# Patient Record
Sex: Female | Born: 1985 | Race: White | Hispanic: No | Marital: Single
Health system: Midwestern US, Community
[De-identification: ages and names within clinical notes are randomized; demographics above are authoritative.]

---

## 2016-06-25 ENCOUNTER — Inpatient Hospital Stay: Admit: 2016-06-25 | Discharge: 2016-06-25 | Attending: Emergency Medicine

## 2016-06-25 DIAGNOSIS — H9201 Otalgia, right ear: Secondary | ICD-10-CM

## 2016-06-25 MED ORDER — NAPROXEN 500 MG PO TABS
500 MG | Freq: Once | ORAL | Status: AC
Start: 2016-06-25 — End: 2016-06-25
  Administered 2016-06-25: 06:00:00 500 mg via ORAL

## 2016-06-25 MED ORDER — NAPROXEN 500 MG PO TABS
500 MG | ORAL_TABLET | Freq: Two times a day (BID) | ORAL | 0 refills | Status: AC
Start: 2016-06-25 — End: ?

## 2016-06-25 MED ORDER — CETIRIZINE-PSEUDOEPHEDRINE ER 5-120 MG PO TB12
5-120 MG | ORAL_TABLET | Freq: Two times a day (BID) | ORAL | 0 refills | Status: AC
Start: 2016-06-25 — End: 2016-07-05

## 2016-06-25 MED ORDER — DIPHENHYDRAMINE HCL 25 MG PO TABS
25 MG | Freq: Once | ORAL | Status: AC
Start: 2016-06-25 — End: 2016-06-25
  Administered 2016-06-25: 06:00:00 50 mg via ORAL

## 2016-06-25 MED FILL — DIPHENHYDRAMINE HCL 25 MG PO TABS: 25 MG | ORAL | Qty: 2

## 2016-06-25 MED FILL — NAPROXEN 500 MG PO TABS: 500 MG | ORAL | Qty: 1

## 2016-06-25 NOTE — ED Notes (Signed)
Discharge instructions reviewed and pt acknowledges understanding. Ambulatory at discharge.     Johnnette Barrios, RN  06/25/16 [phone]

## 2016-06-25 NOTE — ED Provider Notes (Signed)
The history is provided by the patient.   Ear Problem   Location:  Right  Behind ear:  No abnormality  Quality:  Aching and dull  Severity:  Moderate  Onset quality:  Gradual  Duration:  2 days  Timing:  Constant  Progression:  Worsening  Chronicity:  New  Context comment:  Started on antibiotics for dental caries and pain  Relieved by:  Nothing  Worsened by:  Nothing  Ineffective treatments:  None tried  Associated symptoms: congestion, cough and rhinorrhea    Associated symptoms: no ear discharge, no fever, no hearing loss, no neck pain, no sore throat and no tinnitus        Review of Systems   Constitutional: Negative.  Negative for fever.   HENT: Positive for congestion and rhinorrhea. Negative for ear discharge, hearing loss, sore throat and tinnitus.    Eyes: Negative.    Respiratory: Positive for cough.    Cardiovascular: Negative.    Gastrointestinal: Negative.    Genitourinary: Negative.    Musculoskeletal: Negative.  Negative for neck pain.   Skin: Negative.    Neurological: Negative.    Endo/Heme/Allergies: Negative.    All other systems reviewed and are negative.      History reviewed. No pertinent family history.  Social History     Social History   . Marital status: Single     Spouse name: N/A   . Number of children: N/A   . Years of education: N/A     Occupational History   . Not on file.     Social History Main Topics   . Smoking status: Current Every Day Smoker     Packs/day: 1.00     Types: Cigarettes   . Smokeless tobacco: Never Used   . Alcohol use Not on file   . Drug use: No   . Sexual activity: Yes     Partners: Male     Other Topics Concern   . Not on file     Social History Narrative   . No narrative on file     Past Surgical History:   Procedure Laterality Date   . APPENDECTOMY     . CHOLECYSTECTOMY       Past Medical History:   Diagnosis Date   . Cancer (HCC)     Cervical   . GERD (gastroesophageal reflux disease)      Allergies   Allergen Reactions   . Grapeseed Extract [Nutritional  Supplements]      Prior to Admission medications    Medication Sig Start Date End Date Taking? Authorizing Provider   cetirizine-psuedoephedrine (ZYRTEC-D ALLERGY & CONGESTION) 5-120 MG per extended release tablet Take 1 tablet by mouth 2 times daily for 10 days 06/25/16 07/05/16 Yes Caitlyn Roach Khori Rosevear, DO   naproxen (NAPROSYN) 500 MG tablet Take 1 tablet by mouth 2 times daily 06/25/16  Yes Caitlyn Roach Bennett Vanscyoc, DO   ranitidine (ZANTAC) 150 MG tablet  06/06/16   Historical Provider, MD   progesterone (PROMETRIUM) 200 MG capsule  06/23/16   Historical Provider, MD   penicillin v potassium (VEETID) 500 MG tablet  06/15/16   Historical Provider, MD   omeprazole (PRILOSEC) 20 MG delayed release capsule  06/22/16   Historical Provider, MD   ibuprofen (ADVIL;MOTRIN) 800 MG tablet  06/16/16   Historical Provider, MD   fluconazole (DIFLUCAN) 150 MG tablet  06/23/16   Historical Provider, MD       BP (!) 94/52   Pulse  68   Temp 97.6 F (36.4 C)   Resp 18   Ht  (1.651 m)   Wt 135 lb (61.2 kg)   SpO2 97%   BMI 22.47 kg/m     Physical Exam   Constitutional: She is oriented to person, place, and time. She appears well-developed and well-nourished.   HENT:   Head: Normocephalic and atraumatic.   Right Ear: External ear normal. Tympanic membrane is bulging. Tympanic membrane mobility is abnormal.   Left Ear: External ear normal.   Nose: Nose normal.   Mouth/Throat: Oropharynx is clear and moist.   Eyes: Conjunctivae and EOM are normal. Pupils are equal, round, and reactive to light.   Neck: Normal range of motion. Neck supple.   Cardiovascular: Normal rate, regular rhythm, normal heart sounds and intact distal pulses.    Pulmonary/Chest: Effort normal and breath sounds normal.   Abdominal: Soft. Bowel sounds are normal.   Musculoskeletal: Normal range of motion.   Neurological: She is alert and oriented to person, place, and time. GCS eye subscore is 4. GCS verbal subscore is 5. GCS motor subscore is 6.   Skin: Skin is warm and  dry.   Psychiatric: She has a normal mood and affect. Her behavior is normal. Judgment and thought content normal.       MDM:    No results found for this visit on 06/25/16.      Patient drove over one hour to our ER for her ear pain. She was surprised we could pull her records and see she was on antibiotics for dental pain. The patient ear pain is most likely from eustacian tube dysfunction so I am giving her zyrtec D and naprosyn. She needs to finish her antibiotic and call her dentist   My typical dicussion, presentation, and considerations for this patients' chief complaint, diagnosis, differential diagnosis, medications, medication use,  medication safety and medication interactions have been explained and outlined to this patient for this patient encounter. I have stressed need for follow up and reexamination for this encounter and or return to the emergency department if any changes or any concern.      Final Impression    1. Right ear pain              Caitlyn Roach Isaul Landi, DO  06/25/16 [phone]

## 2018-10-22 IMAGING — MR MRI BRAIN W/WO CONTRAST
10 series · 48 of 48 positions shown · IV contrast (prohance)
Comparison: None

HISTORY: Trigeminal neuralgia
TECHNIQUE: Multiplanar, multisequential MRI images of the brain are obtained prior to and following 15 mL ProHance intravenous contrast using a cranial nerve study protocol.

[Series 5: t1_sag · sagittal · B · 5.0mm · 0.72mm/px · 2 of 25 slices shown]
[im 1/25]
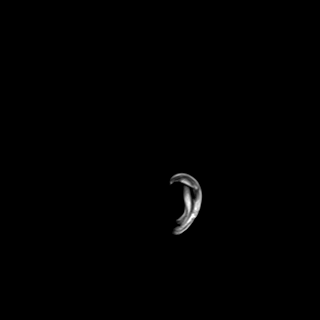
[im 25/25]
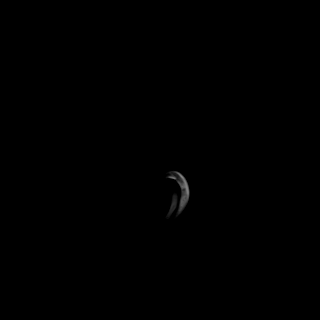

[Series 6: flair_axial_fs · axial · B · 4.0mm · 0.80mm/px · z∈[-64,+82]mm · 4 of 30 slices shown]
[im 1/30]
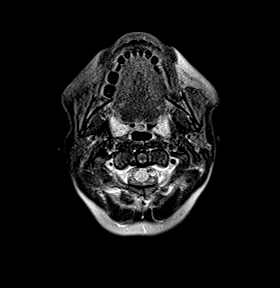
[im 10/30]
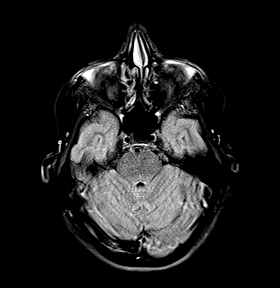
[im 20/30]
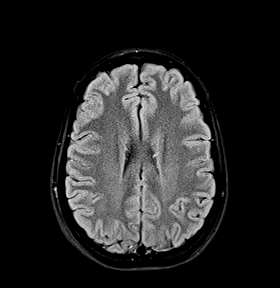
[im 30/30]
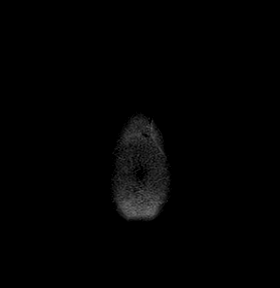

[Series 7: t2_ci3d_axial · axial · B · 1.0mm · 0.74mm/px · z∈[-58,+2]mm · 8 of 64 slices shown]
[im 1/64]
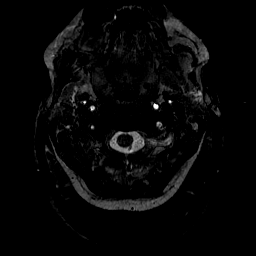
[im 10/64]
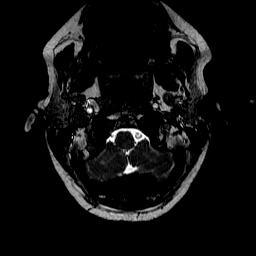
[im 19/64]
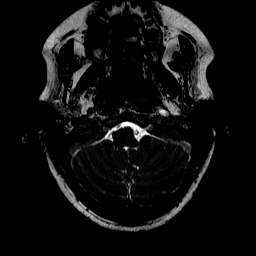
[im 28/64]
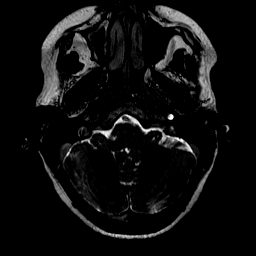
[im 37/64]
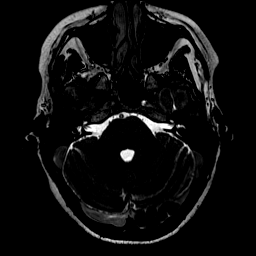
[im 46/64]
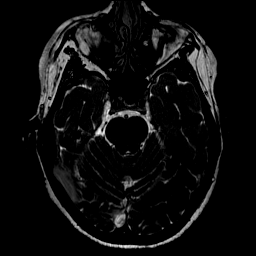
[im 55/64]
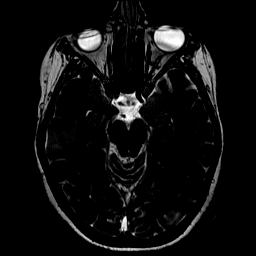
[im 64/64]
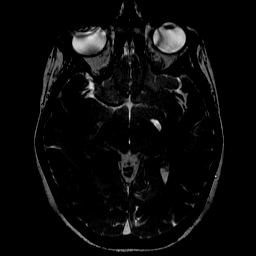

[Series 8: DWI · axial · B · 4.0mm · 1.31mm/px · z∈[-64,+82]mm · 4 of 30 slices shown (1 of 2)]
[im 1/30]
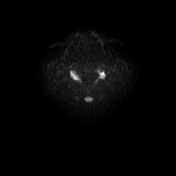
[im 10/30]
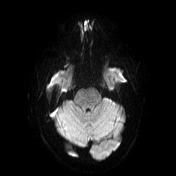
[im 20/30]
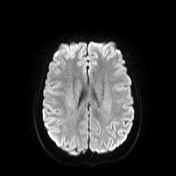
[im 30/30]
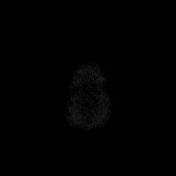

[Series 9: DWI · axial · B · 4.0mm · 1.31mm/px · z∈[-64,+82]mm · 4 of 30 slices shown (2 of 2)]
[im 1/30]
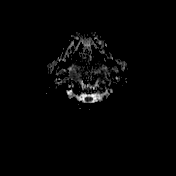
[im 10/30]
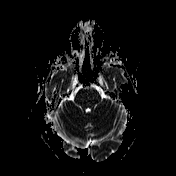
[im 20/30]
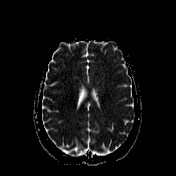
[im 30/30]
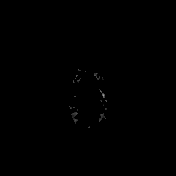

[Series 10: t1_axial_fs · axial · B · 1.0mm · 0.59mm/px · z∈[-56,+0]mm · 7 of 60 slices shown]
[im 1/60]
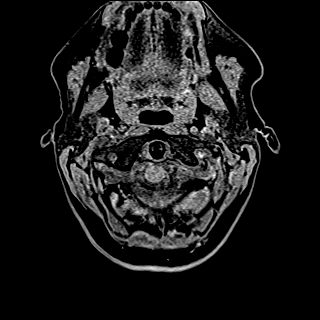
[im 10/60]
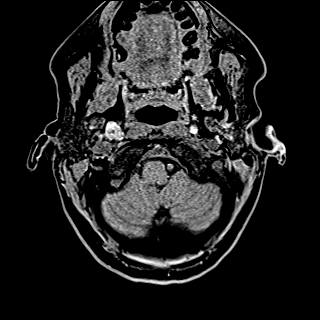
[im 20/60]
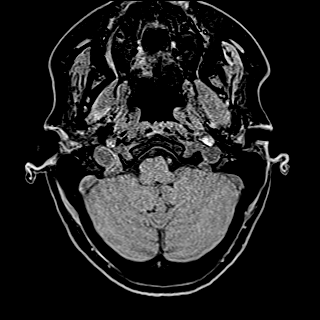
[im 30/60]
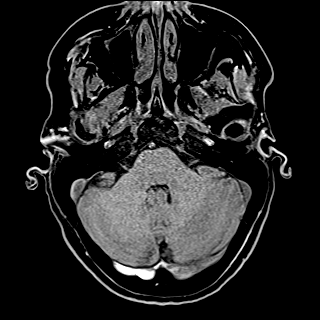
[im 40/60]
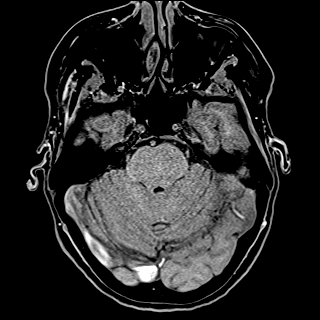
[im 50/60]
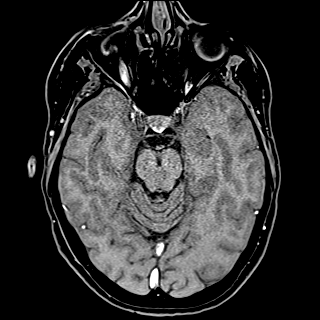
[im 60/60]
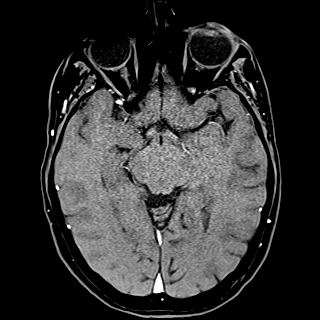

[Series 11: flash_axial · axial · B · 4.0mm · 0.90mm/px · z∈[-64,+82]mm · 4 of 30 slices shown]
[im 1/30]
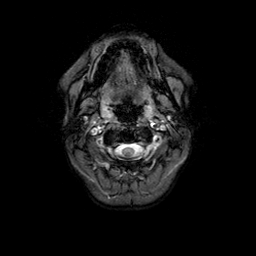
[im 10/30]
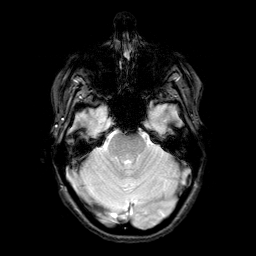
[im 20/30]
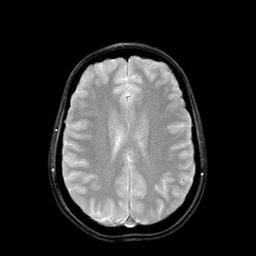
[im 30/30]
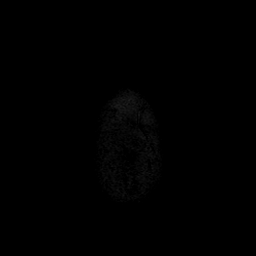

[Series 12: t1_axial_+c_fs · axial · B · 1.0mm · 0.59mm/px · z∈[-56,+0]mm · 7 of 60 slices shown]
[im 1/60]
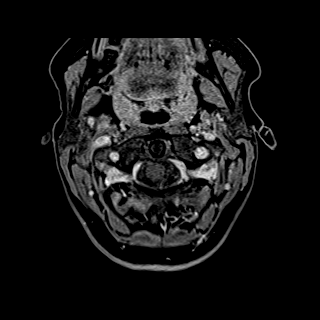
[im 10/60]
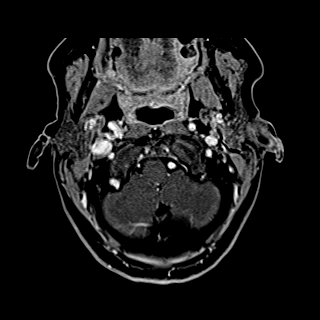
[im 20/60]
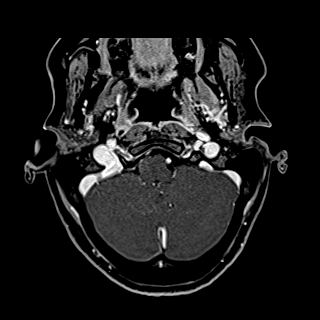
[im 30/60]
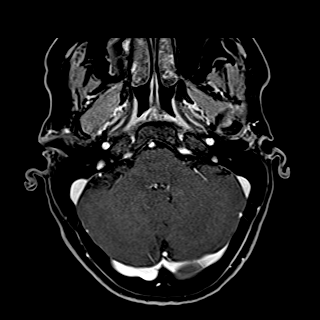
[im 40/60]
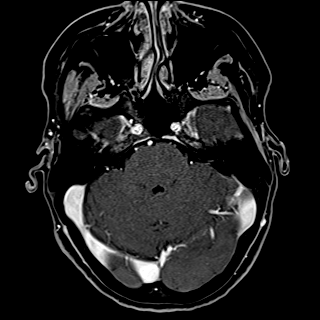
[im 50/60]
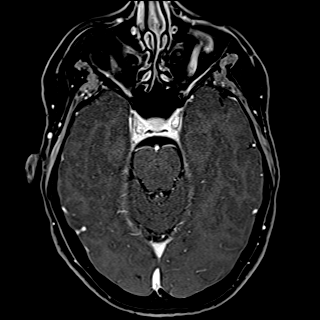
[im 60/60]
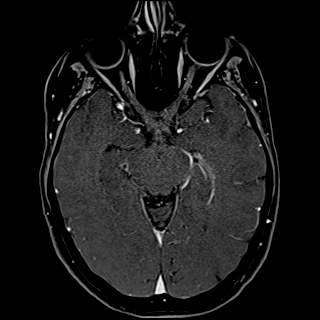

[Series 13: t1_cor_fs_+c · coronal · B · 3.0mm · 0.90mm/px · 4 of 32 slices shown]
[im 1/32]
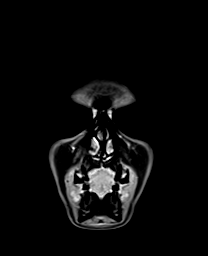
[im 11/32]
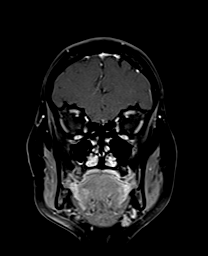
[im 21/32]
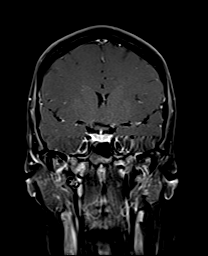
[im 32/32]
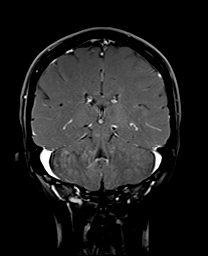

[Series 14: t1_axial+c · axial · B · 4.0mm · 0.72mm/px · z∈[-64,+82]mm · 4 of 30 slices shown]
[im 1/30]
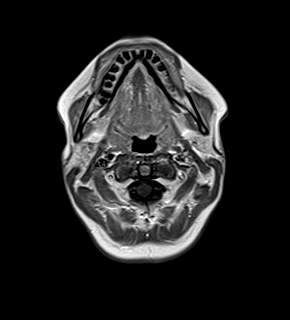
[im 10/30]
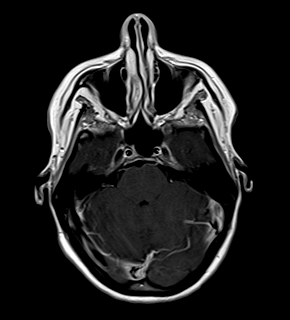
[im 20/30]
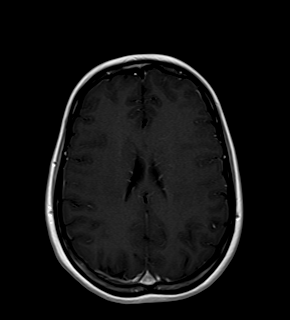
[im 30/30]
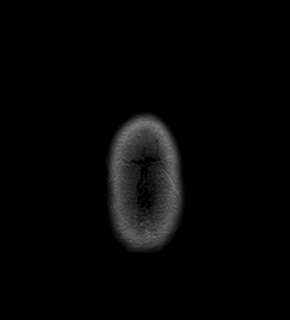

[48 of 48 positions shown; findings below may reference images not displayed]

FINDINGS: The seventh, eighth and fifth cranial nerves appear unremarkable. Meckel's cave region demonstrates nothing unusual bilaterally. No abnormal enhancement.

Internal auditory canals are normal in size and symmetric in appearance. No abnormal signal intensity or contrast enhancement.

Temporal bones demonstrate no destructive lesion. No fluid within the mastoid air cells.

The ventricles and sulci are normal in size and symmetric.

The basal cisterns are patent.

There is no mass effect or midline shift.

There is no restricted diffusion. 

The gray/white matter differentiation is preserved.

There are no intra- or extra-axial fluid collections identified.

Paranasal sinuses are clear

The internal auditory canals and orbits are unremarkable.

There is no abnormal enhancement.
IMPRESSION: Normal MRI of the brain without and with contrast.

## 2019-04-11 IMAGING — US US NON OB TRANSVAGINAL W LTD TA
1 series · 14 of 28 positions shown · non-contrast
Comparison: None

HISTORY: 33 year old female with irregular menstruation. LMP: 03/22/2019 (Day 21).
TECHNIQUE: Gray-scale and color Doppler ultrasound of the pelvis was performed.

[Series 1: us non ob transvaginal w ltd ta · 14 of 40 slices shown]
[im 2/40]
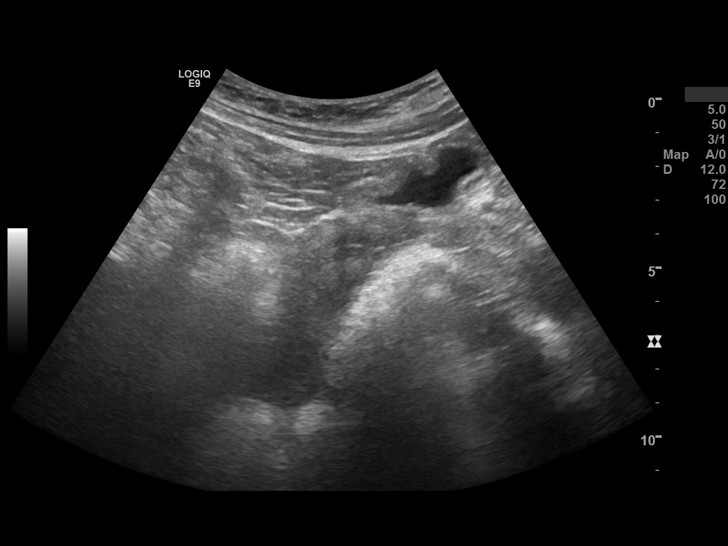
[im 5/40]
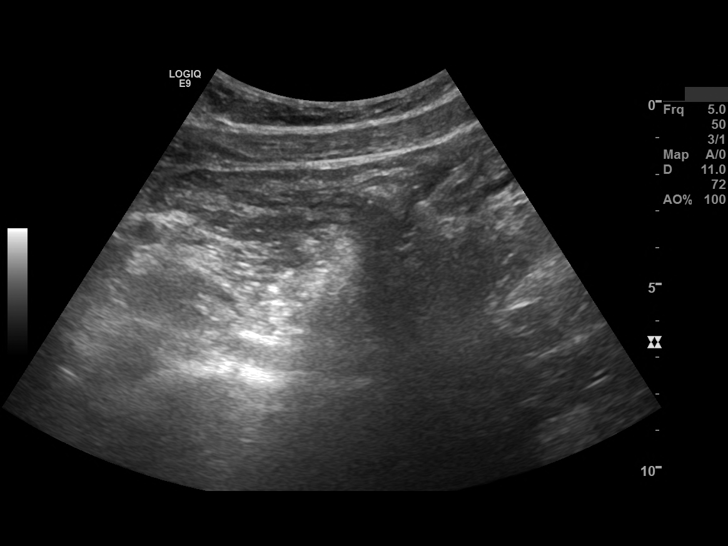
[im 8/40]
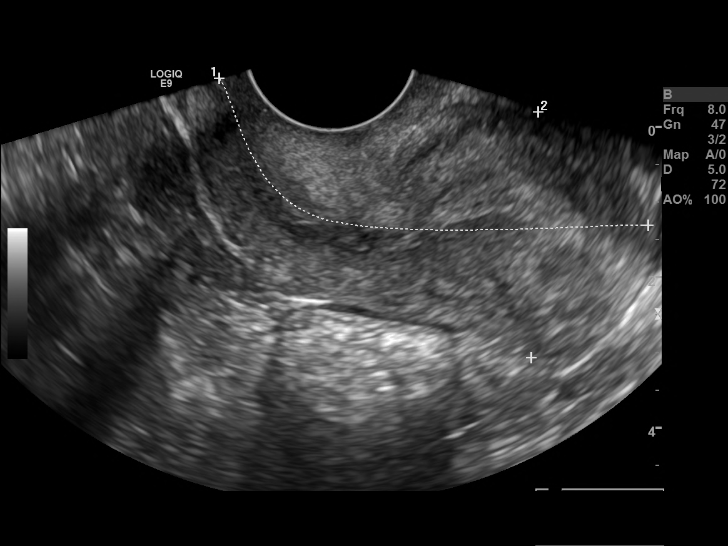
[im 11/40]
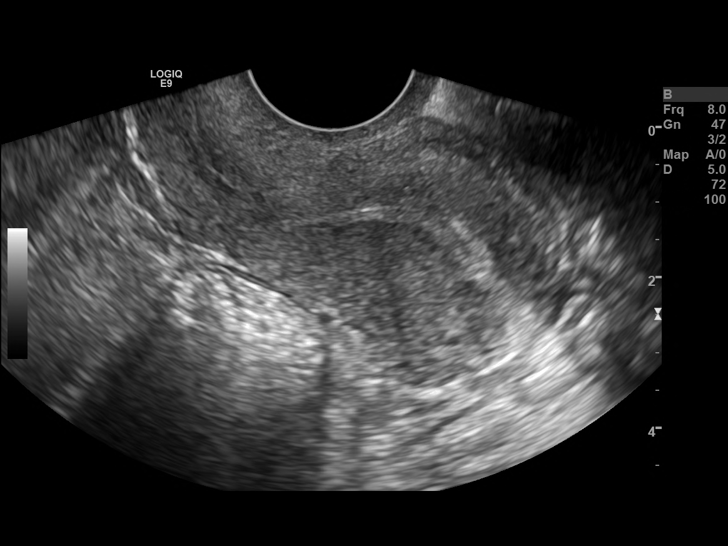
[im 14/40]
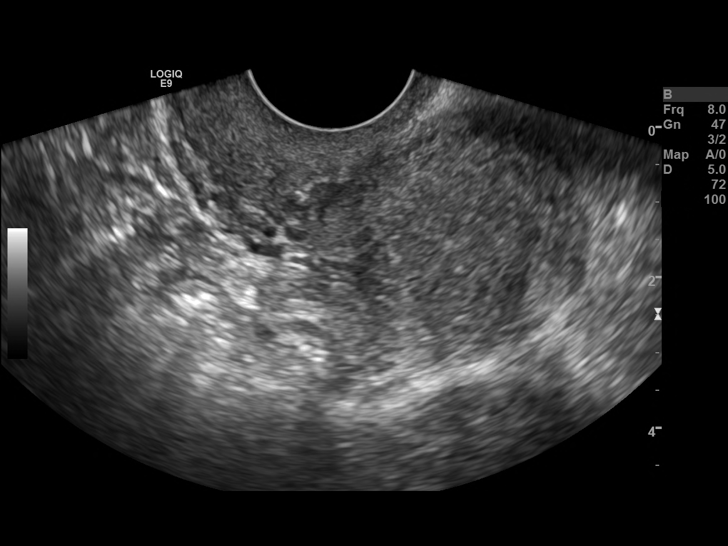
[im 16/40]
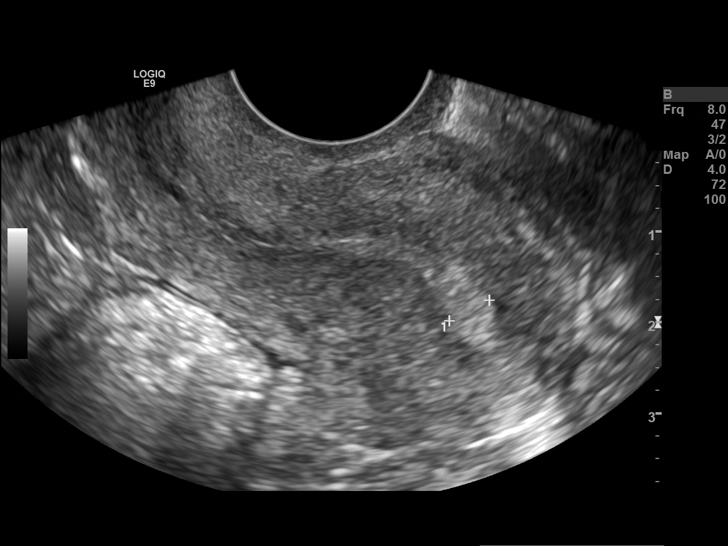
[im 19/40]
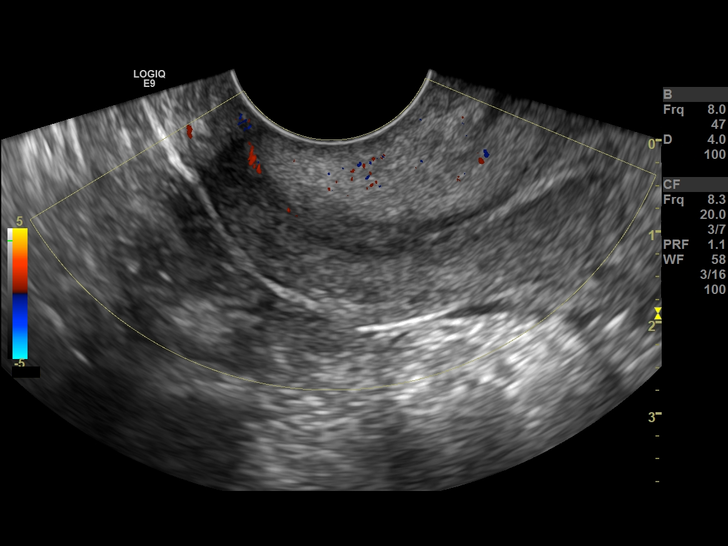
[im 22/40]
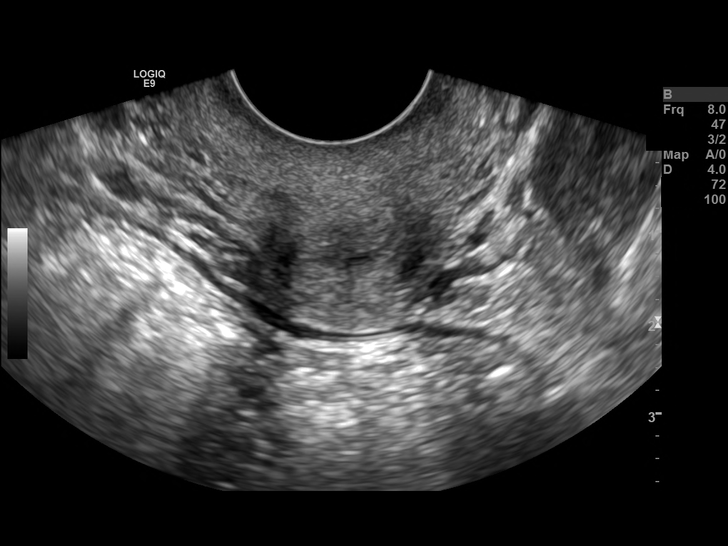
[im 25/40]
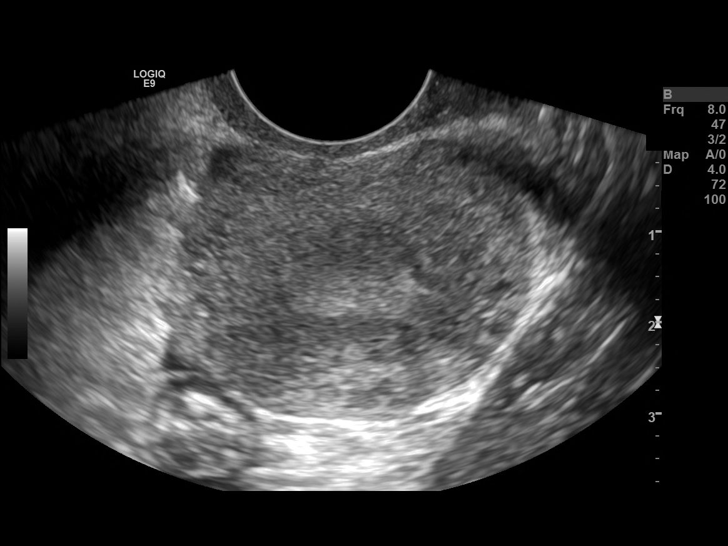
[im 28/40]
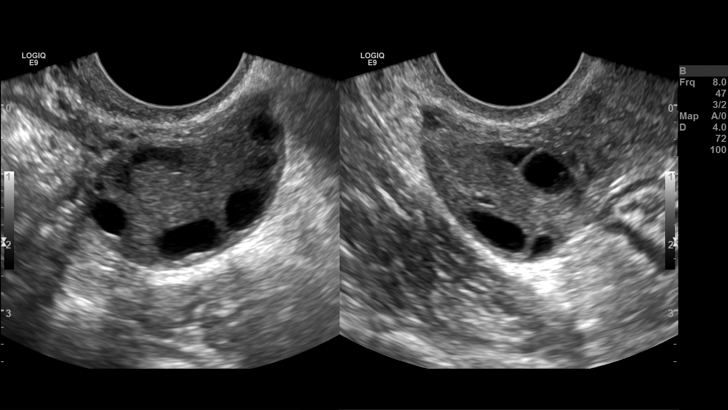
[im 31/40]
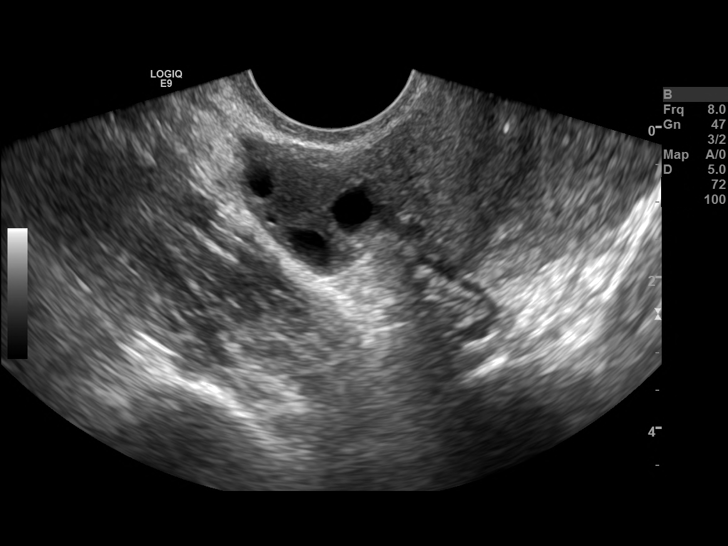
[im 34/40]
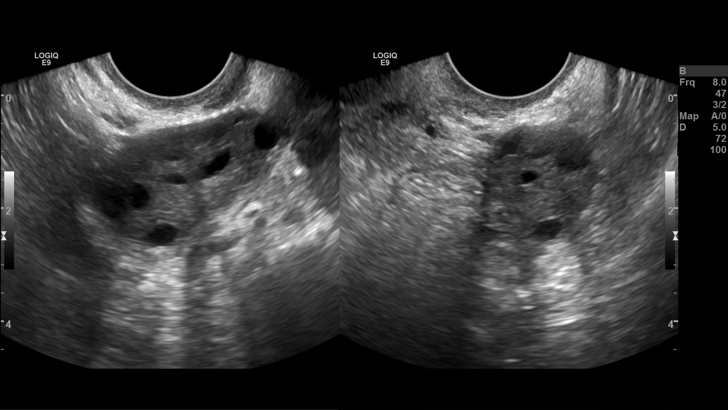
[im 37/40]
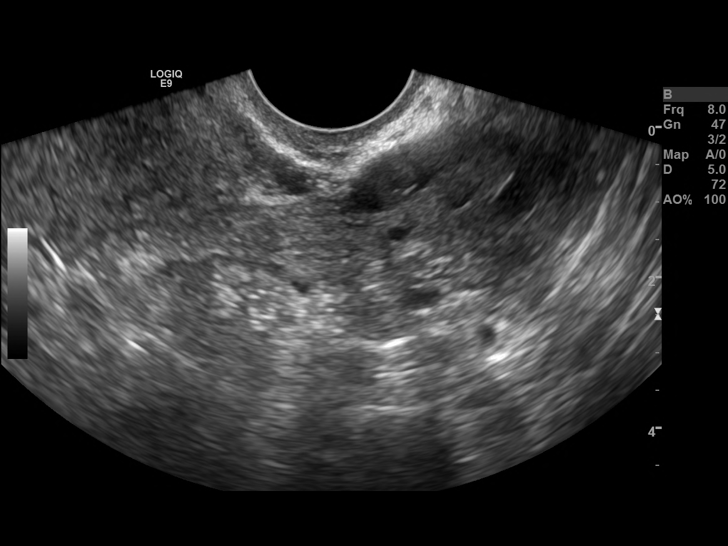
[im 40/40]
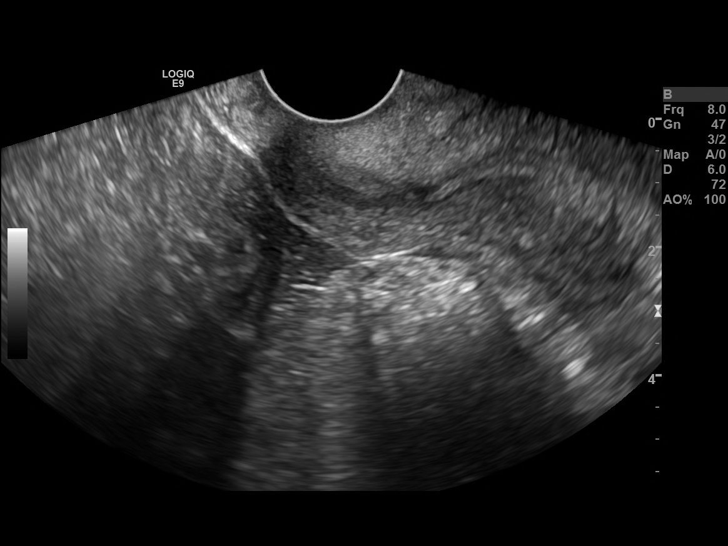

[14 of 28 positions shown; findings below may reference images not displayed]

FINDINGS: Image quality: Satisfactory.

UTERUS: 

*
Orientation: Retroverted, Retroflexed. 

*
Size: Measures 32.7 x 67.57 x 36.56 mm. 

*
Lesions: None visualized.

*
Endometrial Stripe: Measures 5.0 mm, which is mildly thinned for secretory stage of the menstrual cycle.

OVARIES: 

RIGHT:

*
Size: 20.1 x 32.1 x 22.4 mm 

*
Volume: 7.54 mL.

*
Multiple follicles are noted.

LEFT: 

*
Size: 20.2 x 35.6 x 20.1 mm 

*
Volume: 7.57 mL.

*
Multiple follicles are noted.

PERITONEUM: 

No free fluid identified.

BLADDER: 

Not imaged.
IMPRESSION: Mildly thinned endometrial stripe for secretory stage of menstrual cycle.

## 2020-01-02 IMAGING — US US SOFT TISSUE NECK RT
1 series · 14 of 25 positions shown · non-contrast
Comparison: None.

HISTORY: Acute lymphadenitis.
TECHNIQUE: High-resolution duplex ultrasound with color-flow Doppler was performed over and around the area of interest indicated by the patient in the right neck.

[Series 1: us soft tissue neck right · 14 of 26 slices shown]
[im 1/26]
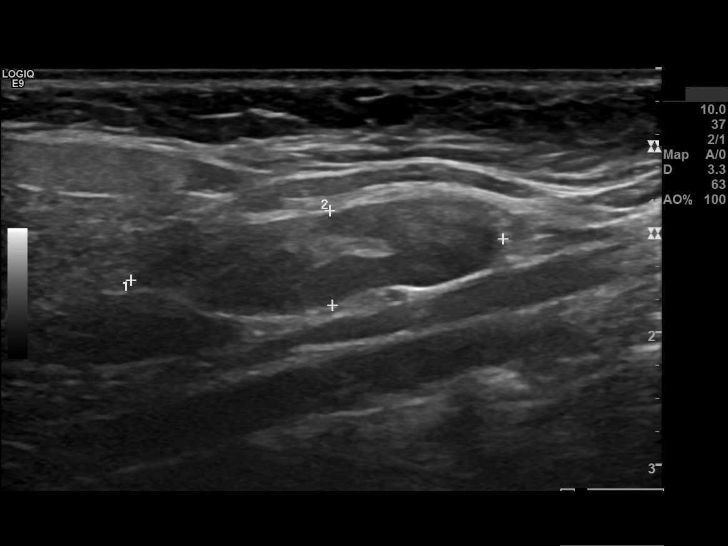
[im 3/26]
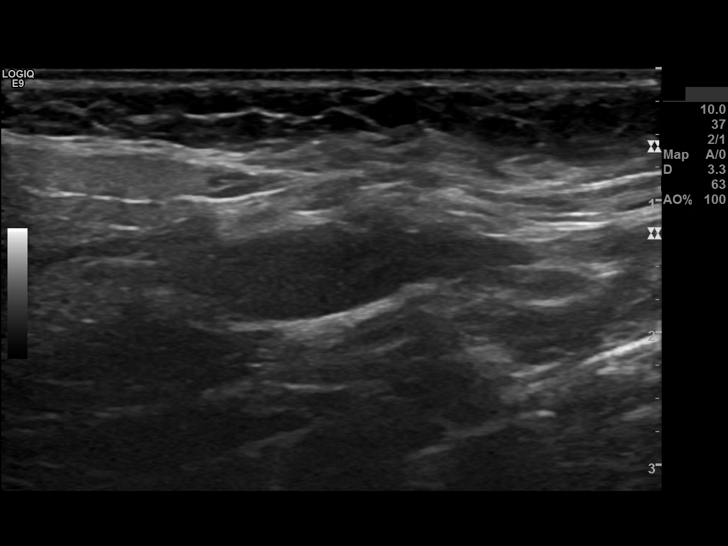
[im 5/26]
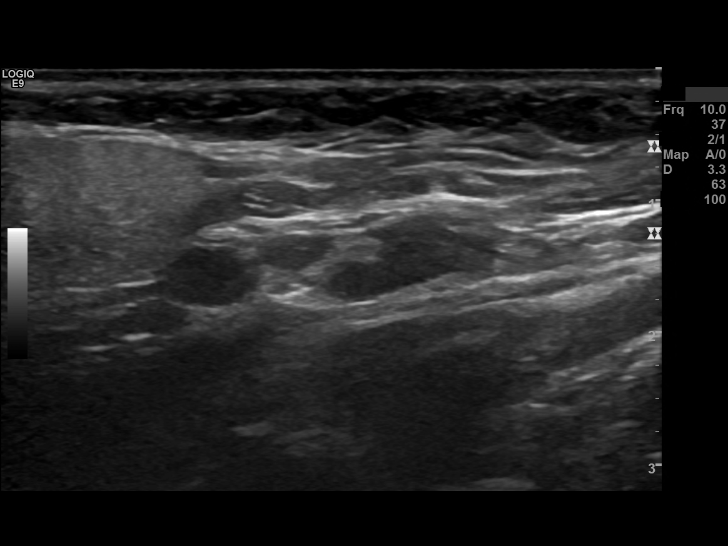
[im 7/26]
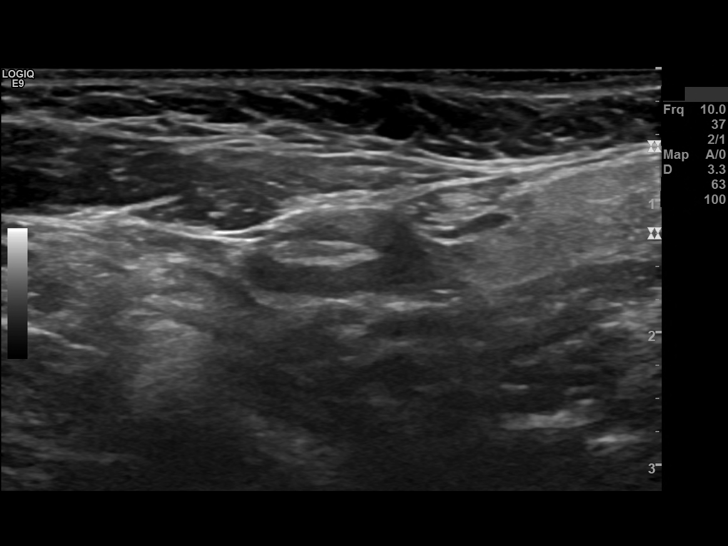
[im 9/26]
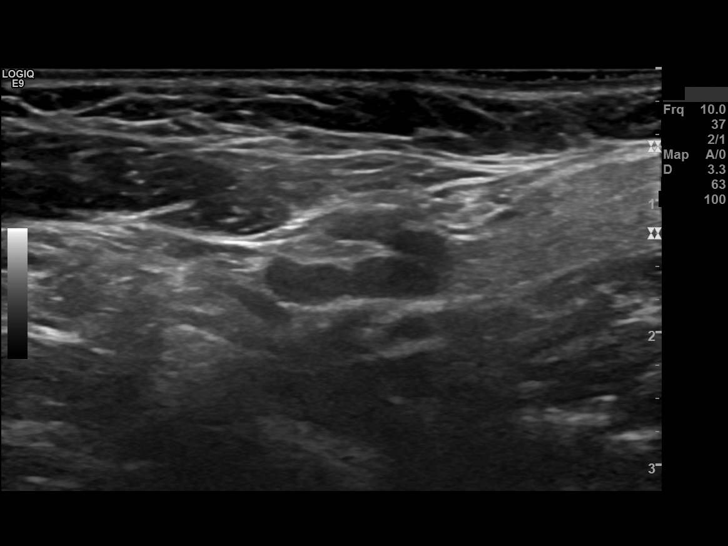
[im 10/26]
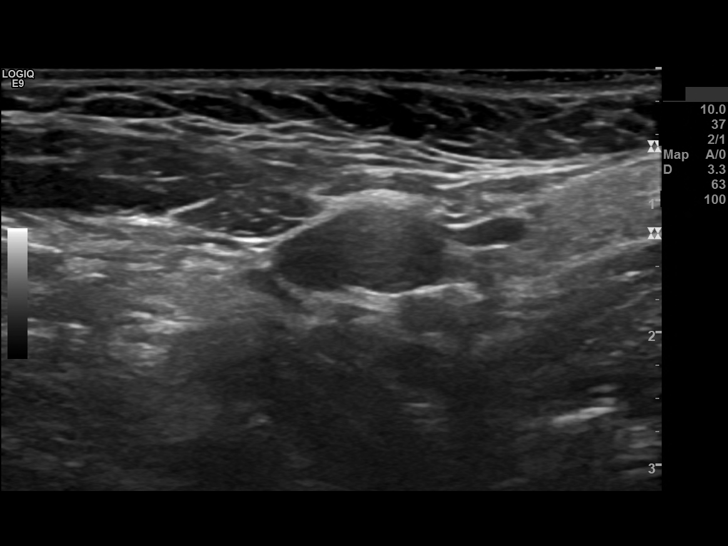
[im 12/26]
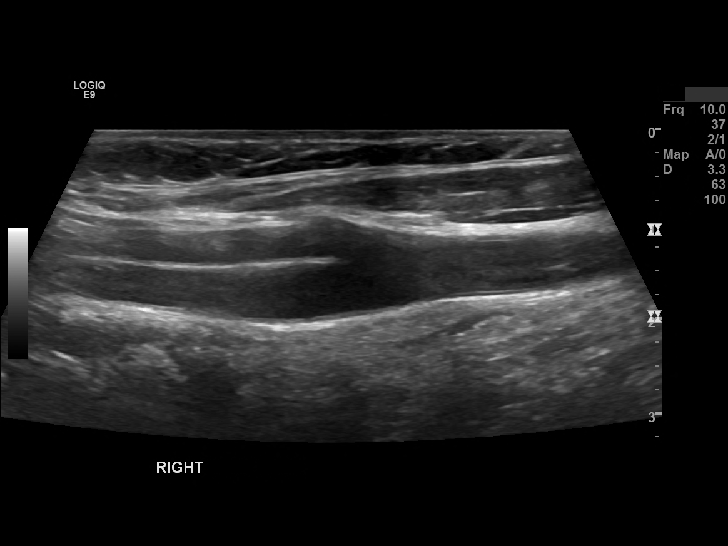
[im 14/26]
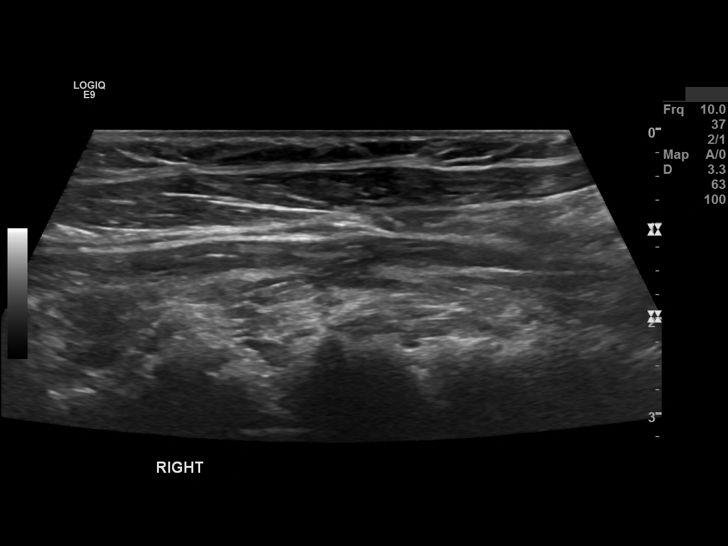
[im 16/26]
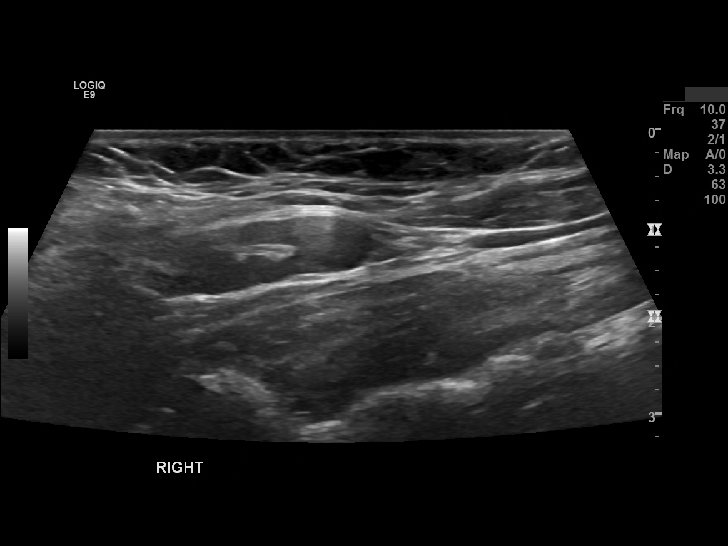
[im 17/26]
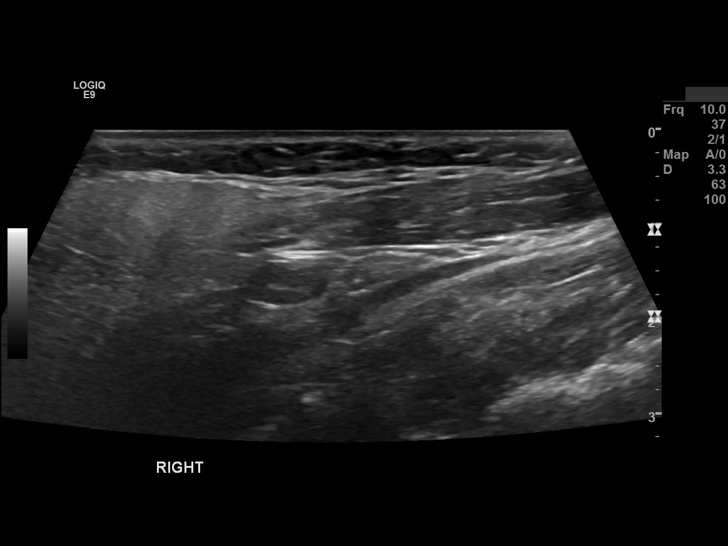
[im 19/26]
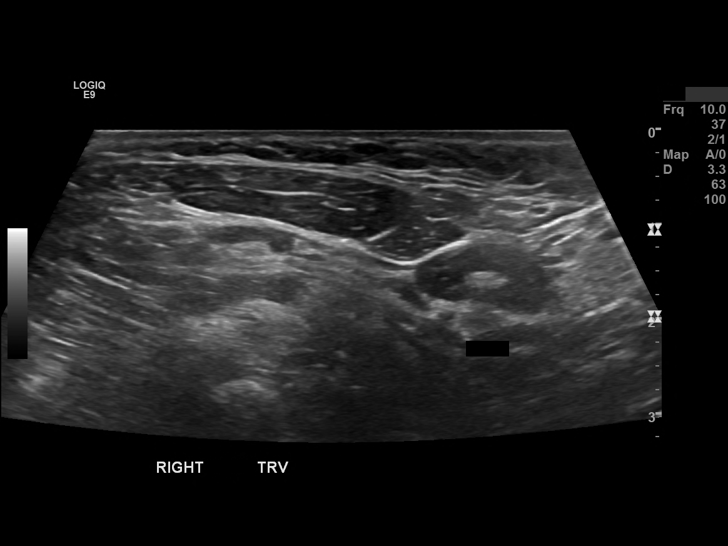
[im 21/26]
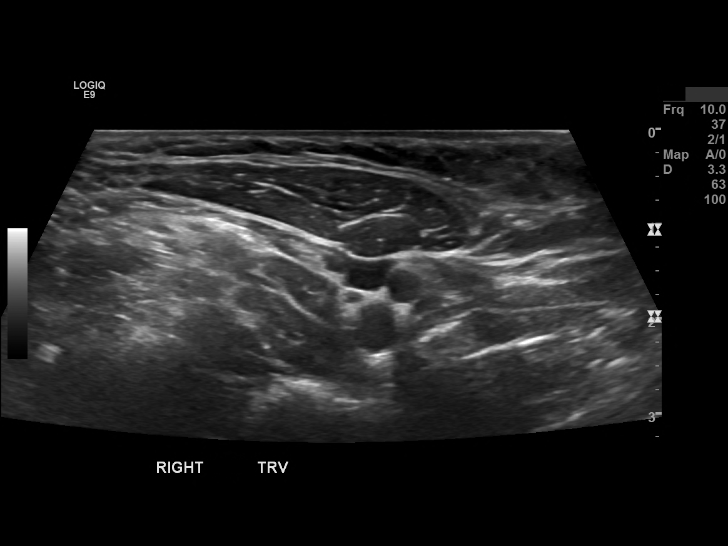
[im 23/26]
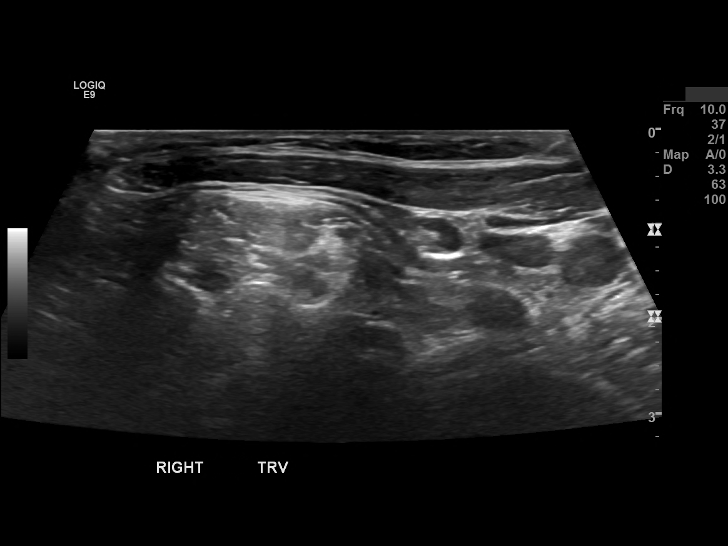
[im 26/26]
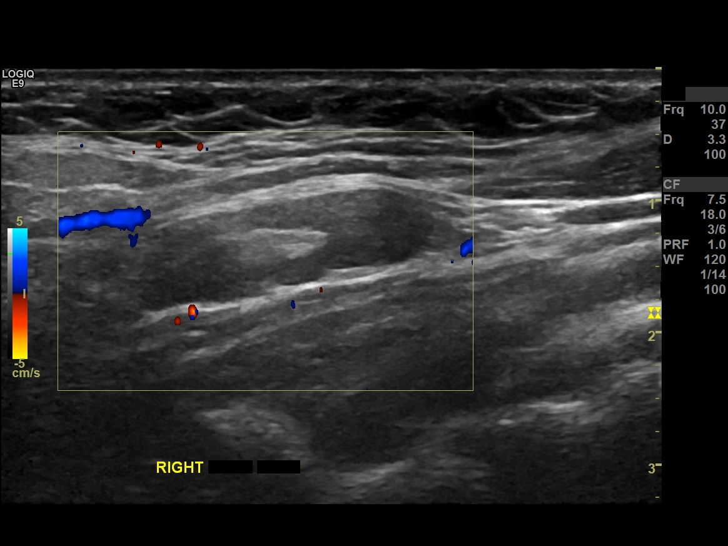

[14 of 25 positions shown; findings below may reference images not displayed]

FINDINGS: In the area of interest, right neck level II, there is a solitary lymph node that appears anatomically normal measuring 28 x 7 x 15 mm. Uniformly smooth cortex with well-defined fatty hilum. Possibly a reactive lymph node. No other lymph nodes are evident.
IMPRESSION: Solitary, anatomically normal appearing lymph node, possibly reactive. Monitor clinically. If the lymph node continues to increase in size, tissue sampling may be appropriate.

## 2020-03-19 IMAGING — MR MRA HEAD WO/W CONTRAST
6 series · 48 of 48 positions shown · IV contrast (20cc PROHANCE)
Comparison: MRI brain 10/22/18

REASON FOR EXAM: 34 year-old female with symptoms and signs involving the nervous system. History of trigeminal neuralgia. Patient reports worsening symptoms including headaches, vision problems and face pain.
TECHNIQUE: Multiplanar, multisequence MR angiographic images of the head were acquired without and with the administration of IV contrast.  3-D rotating MIP images were generated with post-processing software. The patient received an intravenous dose of 20 mL ProHance.

[Series 2: bSSFP · axial · 10.0mm · 1.17mm/px · z∈[-27,+152]mm · 3 of 15 slices shown]
[im 1/15]
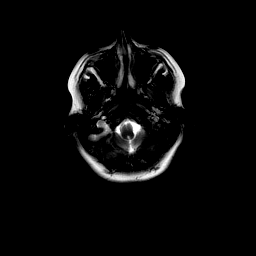
[im 8/15]
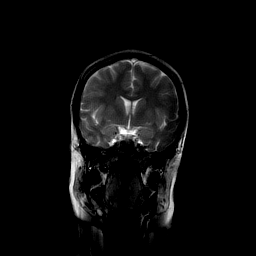
[im 15/15]
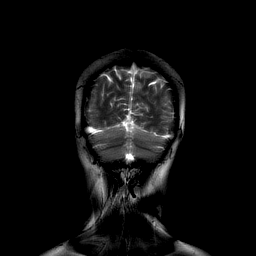

[Series 3: t1_sag · sagittal · 5.0mm · 0.45mm/px · 3 of 24 slices shown]
[im 1/24]
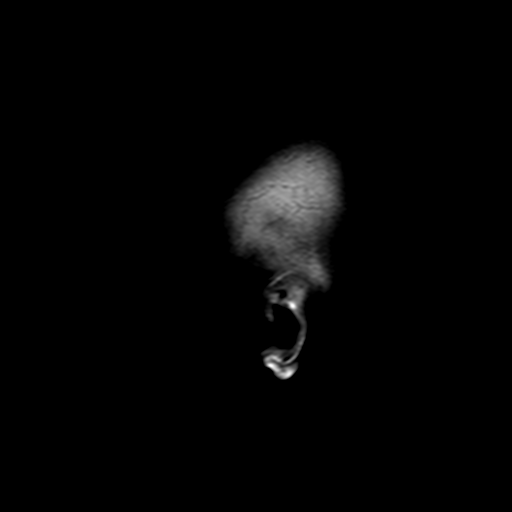
[im 12/24]
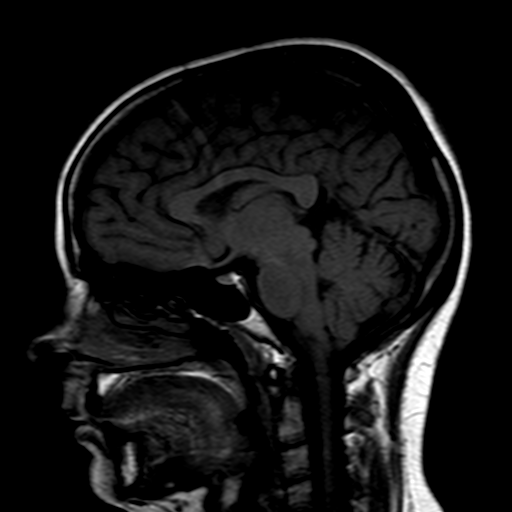
[im 24/24]
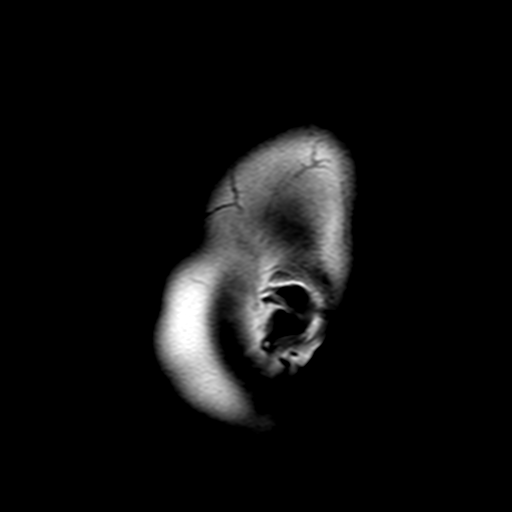

[Series 5: tof_3d_cow · axial · 0.9mm · 0.86mm/px · z∈[-57,+48]mm · 16 of 118 slices shown]
[im 1/118]
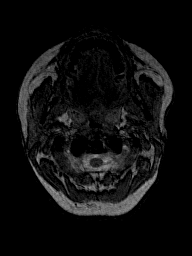
[im 8/118]
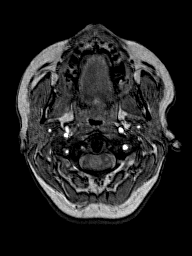
[im 16/118]
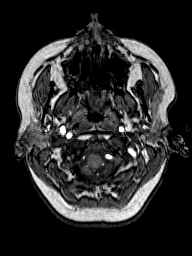
[im 24/118]
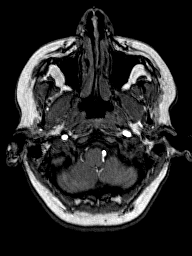
[im 32/118]
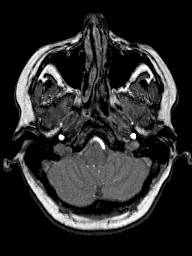
[im 40/118]
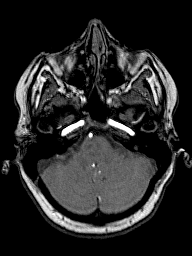
[im 47/118]
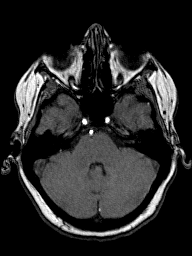
[im 55/118]
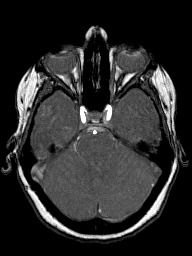
[im 63/118]
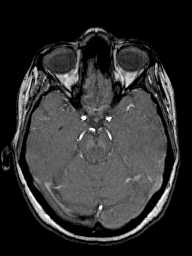
[im 71/118]
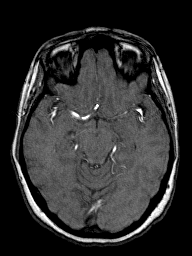
[im 79/118]
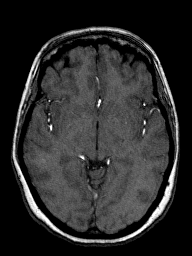
[im 86/118]
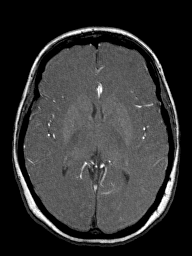
[im 94/118]
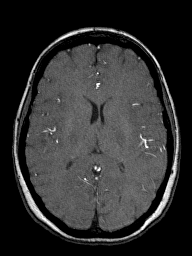
[im 102/118]
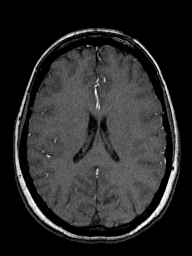
[im 110/118]
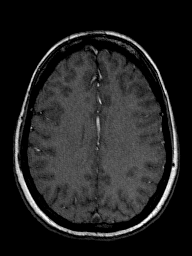
[im 118/118]
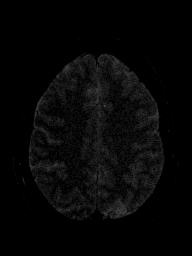

[Series 11: t1_axial_fs · axial · 4.0mm · 0.43mm/px · z∈[-55,+48]mm · 3 of 25 slices shown]
[im 1/25]
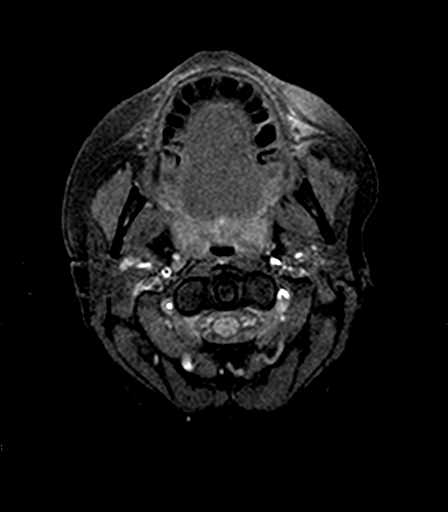
[im 13/25]
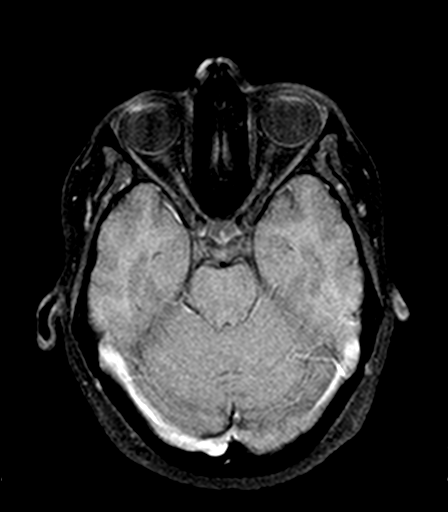
[im 25/25]
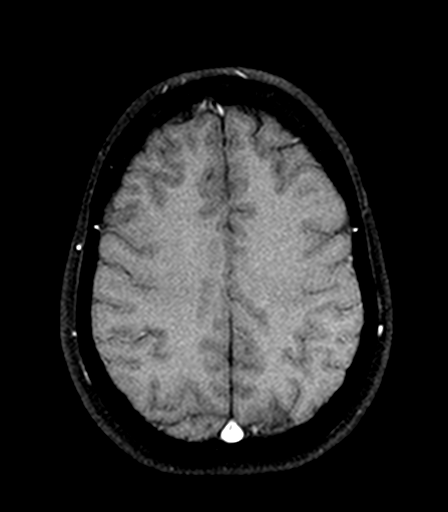

[Series 16: fl3d_ce_cor +c_arterial · coronal · 1.1mm · 0.88mm/px · 12 of 86 slices shown]
[im 1/86]
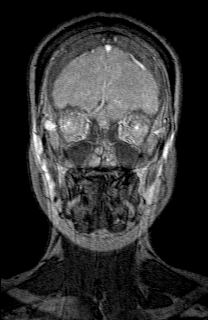
[im 8/86]
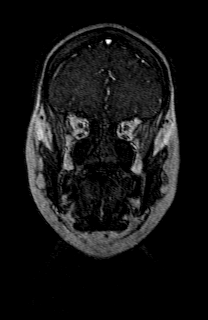
[im 16/86]
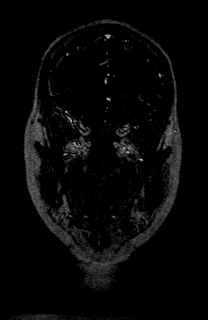
[im 24/86]
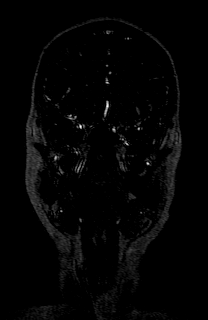
[im 31/86]
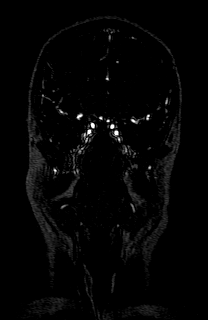
[im 39/86]
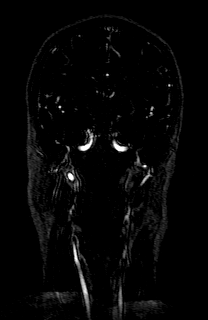
[im 47/86]
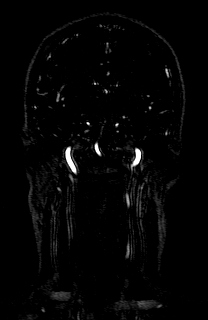
[im 55/86]
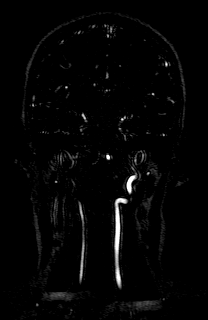
[im 62/86]
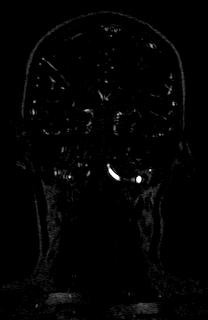
[im 70/86]
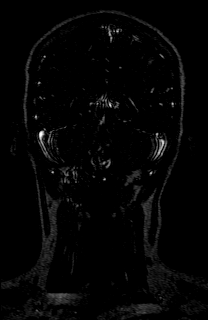
[im 78/86]
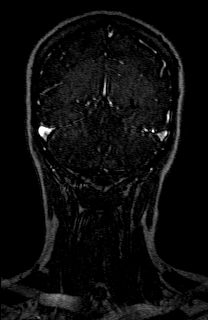
[im 86/86]
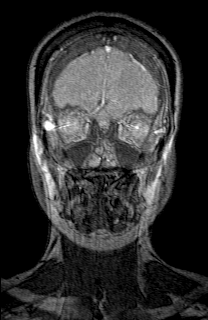

[Series 17: fl3d_ce_cor +c_arterial_sub · coronal · 1.1mm · 0.88mm/px · 11 of 82 slices shown]
[im 1/82]
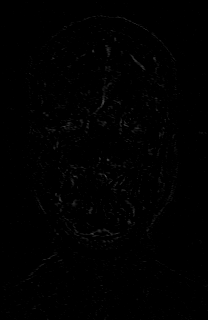
[im 9/82]
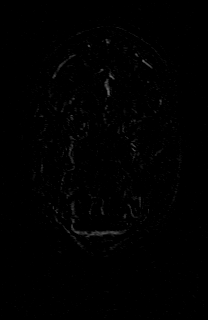
[im 17/82]
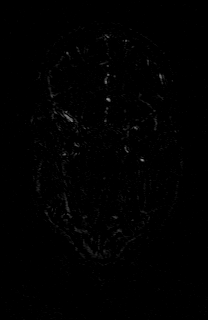
[im 25/82]
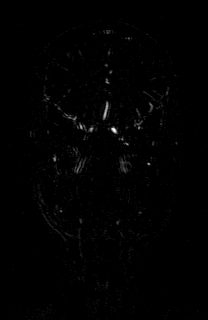
[im 33/82]
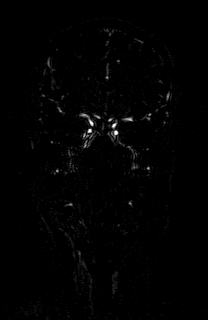
[im 41/82]
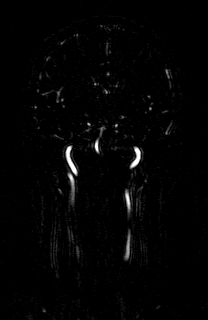
[im 49/82]
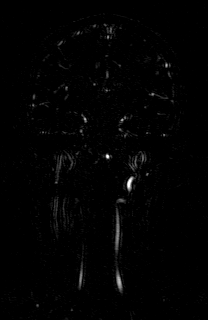
[im 57/82]
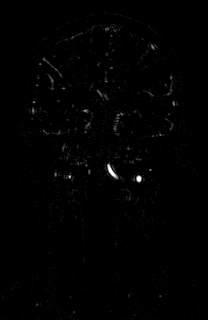
[im 65/82]
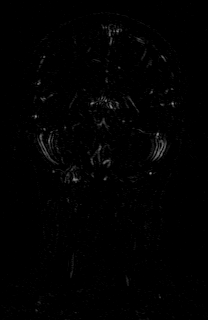
[im 73/82]
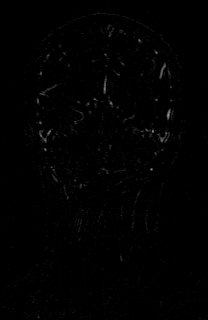
[im 82/82]
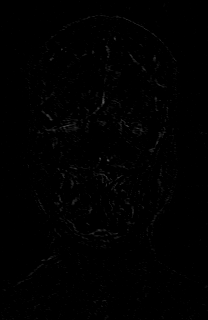

[48 of 48 positions shown; findings below may reference images not displayed]

FINDINGS: ANTERIOR:  No significant stenosis in the bilateral intracranial ICA, MCA and ACA.

POSTERIOR:  No significant stenosis in the bilateral intracranial vertebral arteries and basilar artery. Unremarkable bilateral PCA. Hypoplastic right V4 vertebral artery, an anatomic variant.

OTHER:  No aneurysm or vascular malformation.  Limited sequence visualization of the brain parenchyma reveals nothing unusual.
IMPRESSION: No abnormality to explain patient's symptoms.

## 2020-07-15 IMAGING — US US SOFT TISSUE NECK RT
1 series · 7 of 7 positions shown · non-contrast
Comparison: 01/02/2020 neck ultrasound.

HISTORY: Enlarged lymph node, 34-year-old female.
TECHNIQUE: Neck ultrasound on the right.

[Series 1: us soft tissue head neck · 7 of 7 slices shown]
[im 1/7]
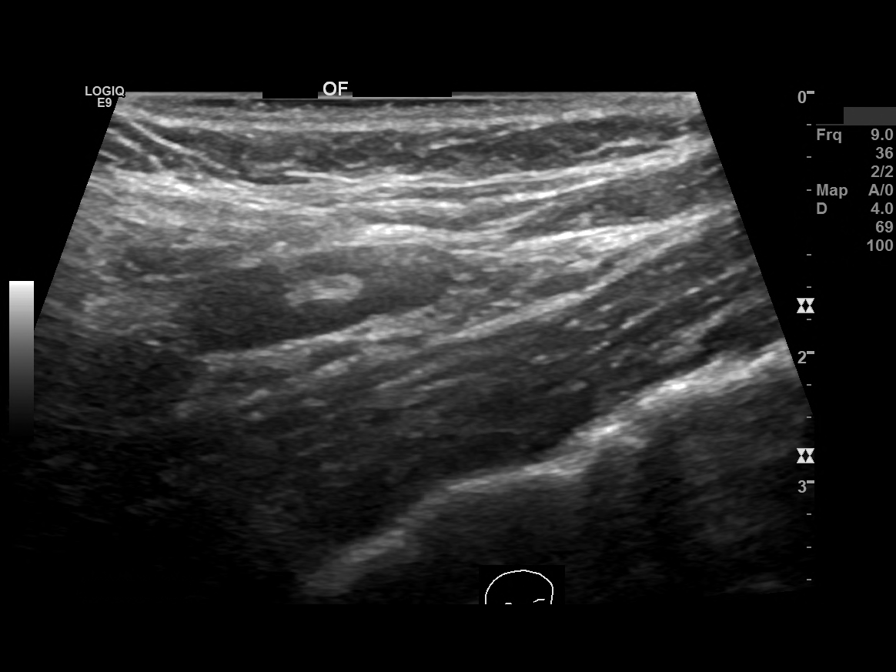
[im 2/7]
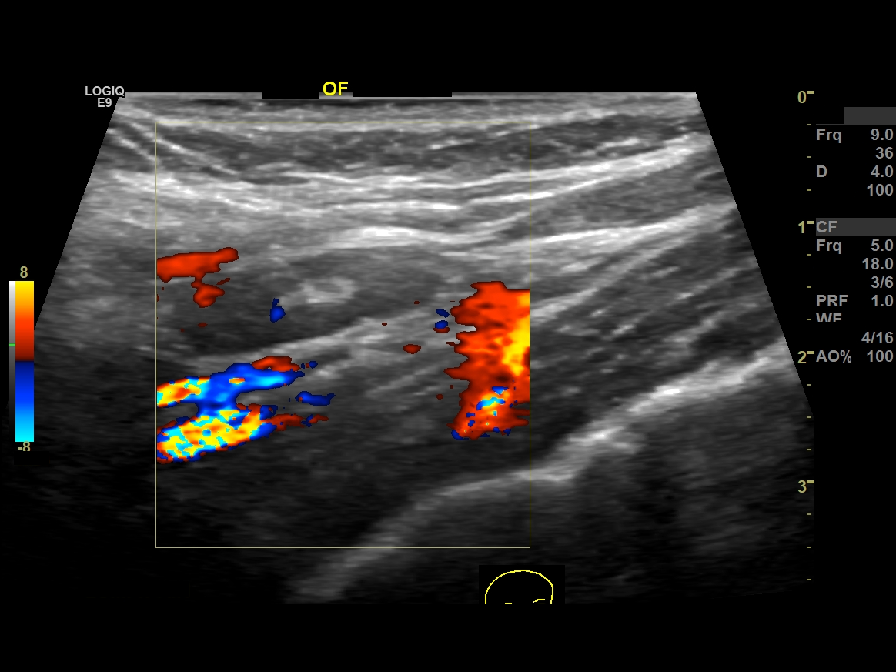
[im 3/7]
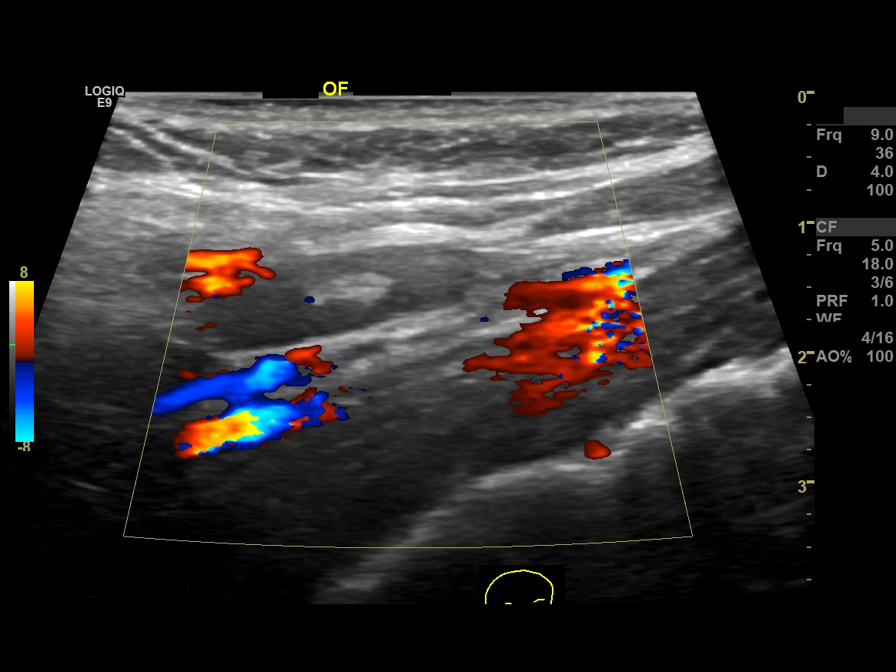
[im 4/7]
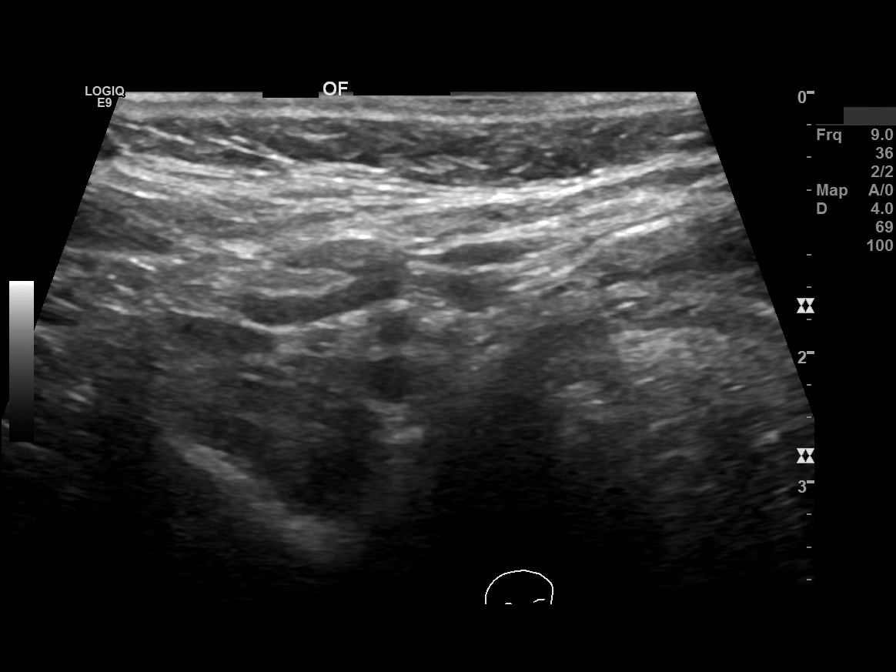
[im 5/7]
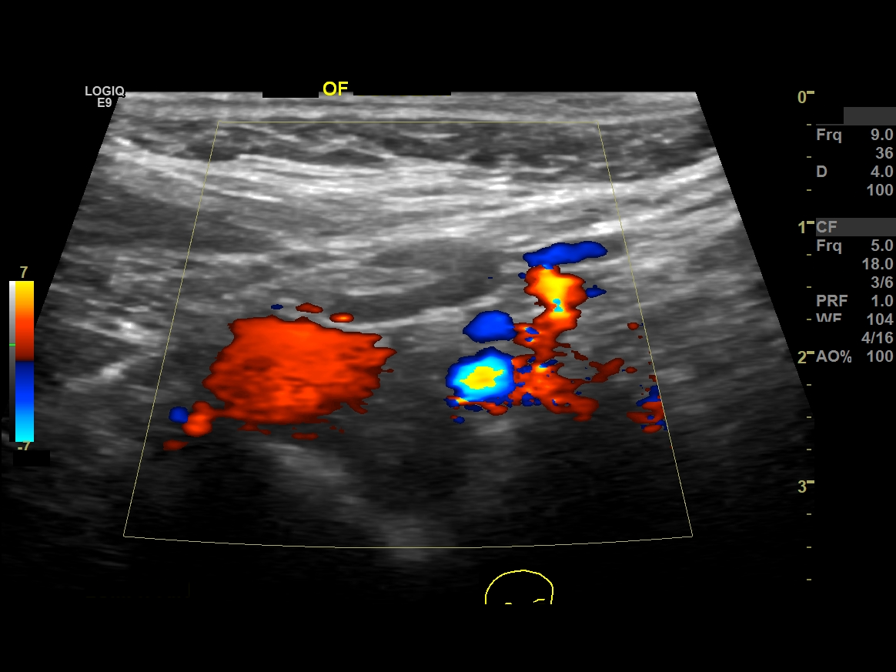
[im 6/7]
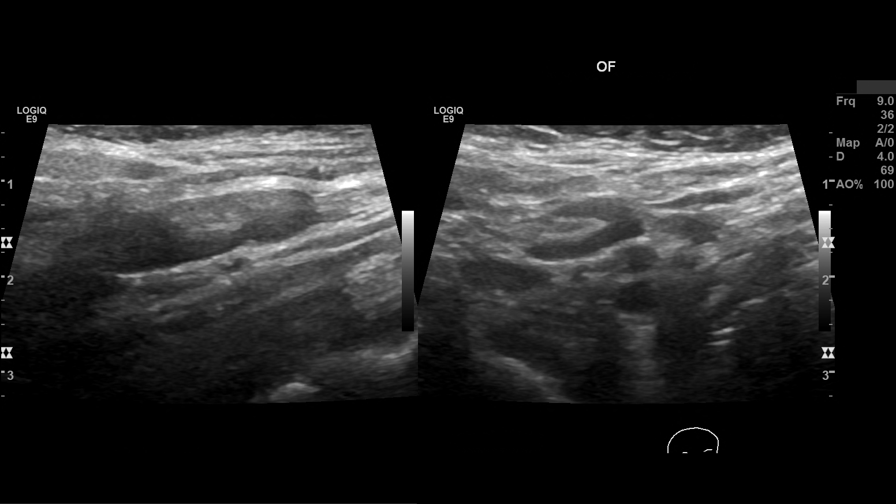
[im 7/7]
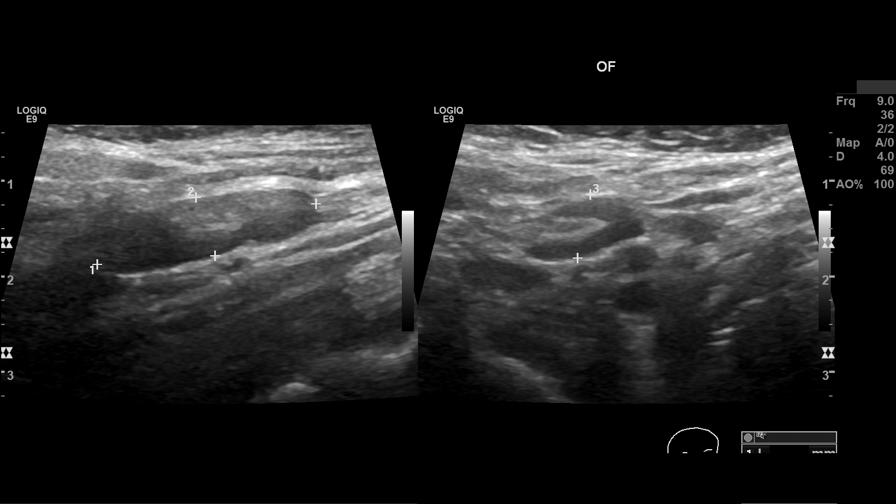

[7 of 7 positions shown; findings below may reference images not displayed]

FINDINGS: Again noted, region II right neck lymph node is seen currently measuring 24 x 6 x 7 mm, has normal fatty hilus and not significantly vascular. Previous measurement was 28 x 7 x 15 mm, therefore smaller. No other lymph nodes identified.
IMPRESSION: Normal-appearing lymph node in the right neck, normal fatty hilus. Smaller than previous.

## 2020-11-25 IMAGING — US US NON OB TRANSVAGINAL W LTD TA
1 series · 14 of 28 positions shown · non-contrast
Comparison: Previous pelvic ultrasound April 11, 2019

INDICATION: 35 year-old female with pelvic and perineal pain. History of previous LEEP procedure.
TECHNIQUE: Transabdominal and transvaginal ultrasound examinations of pelvis were performed.

[Series 1: us non ob transvaginal w ltd ta · 14 of 37 slices shown]
[im 2/37]
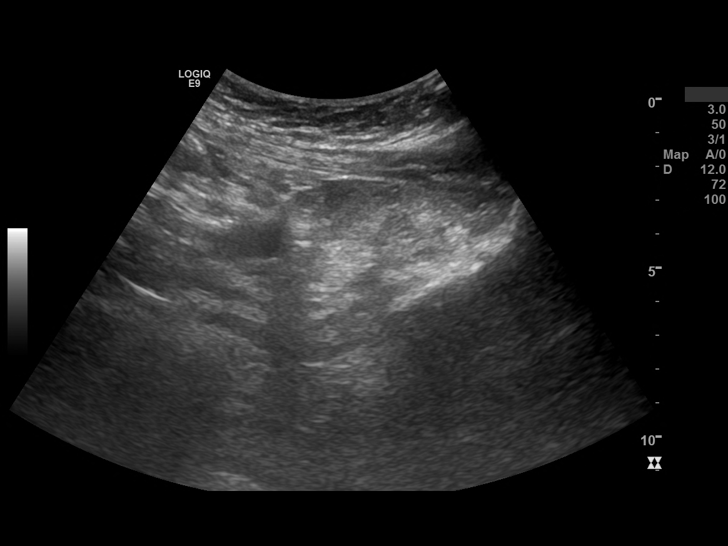
[im 5/37]
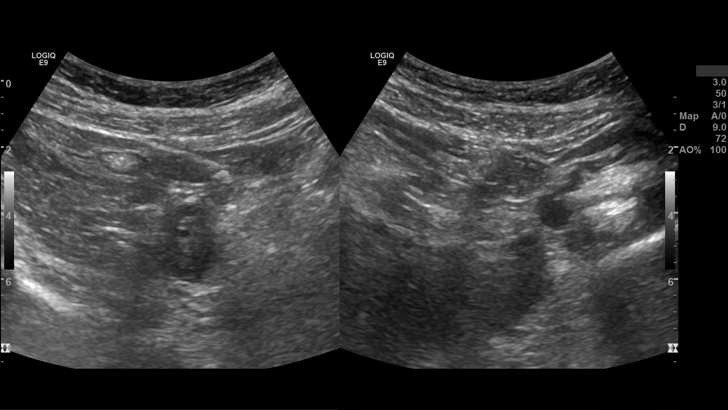
[im 7/37]
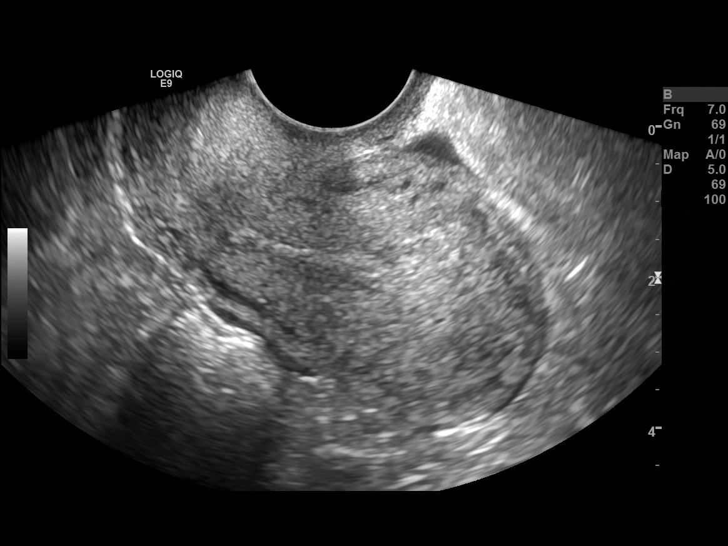
[im 10/37]
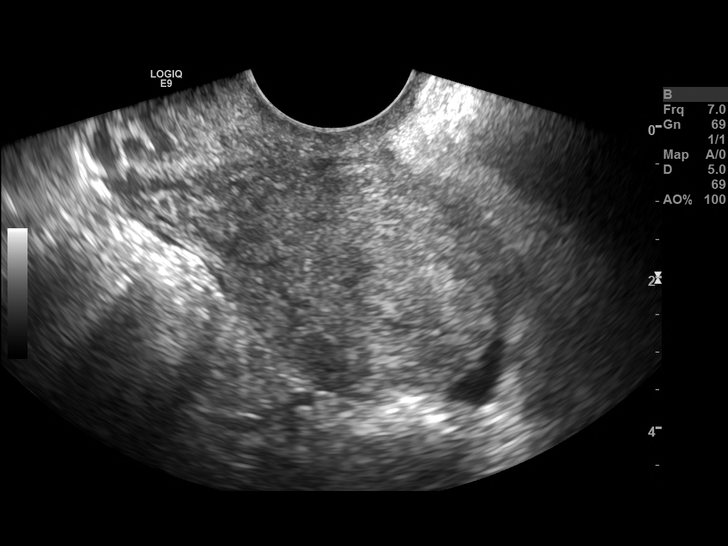
[im 13/37]
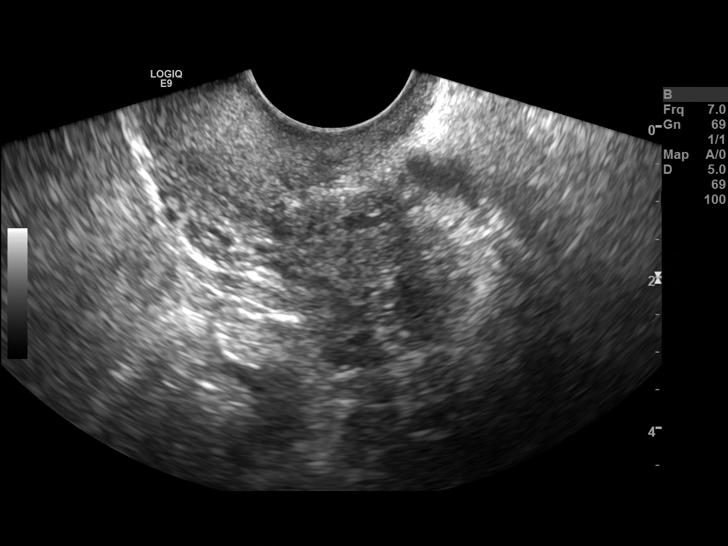
[im 15/37]
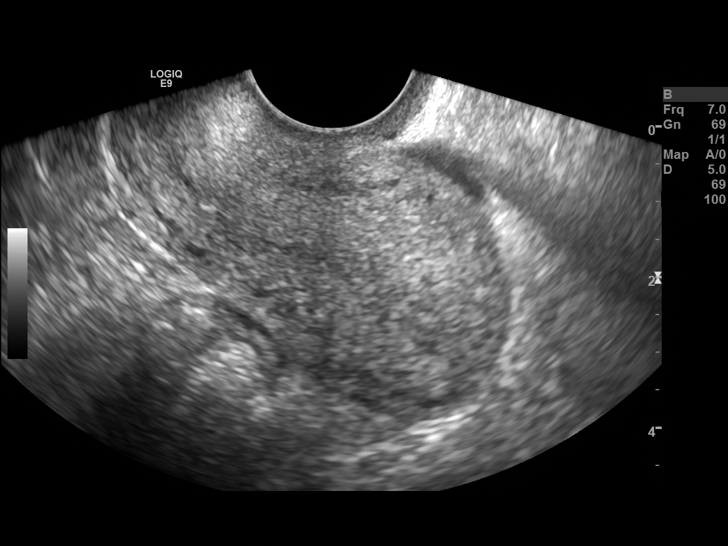
[im 18/37]
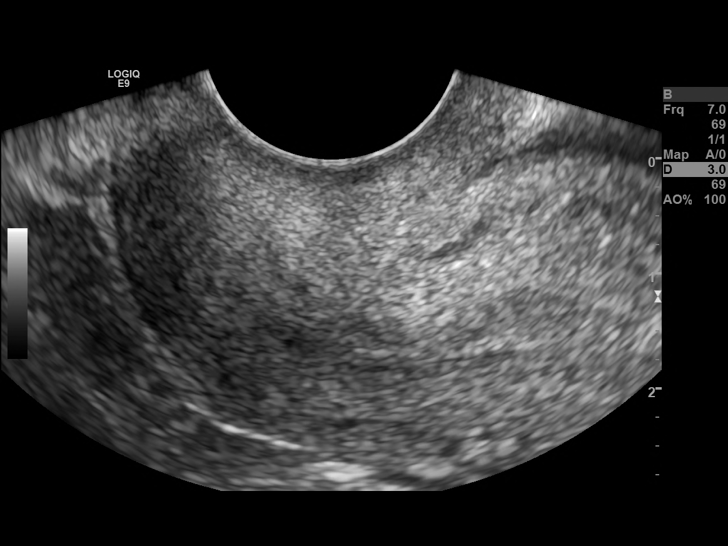
[im 21/37]
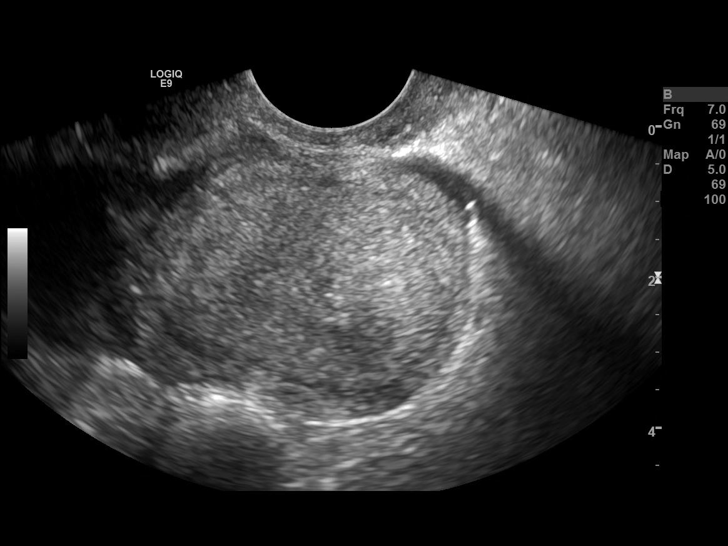
[im 23/37]
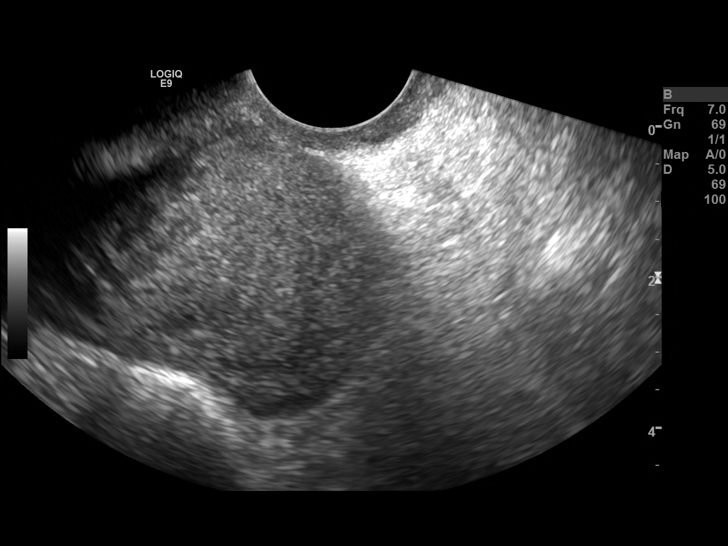
[im 26/37]
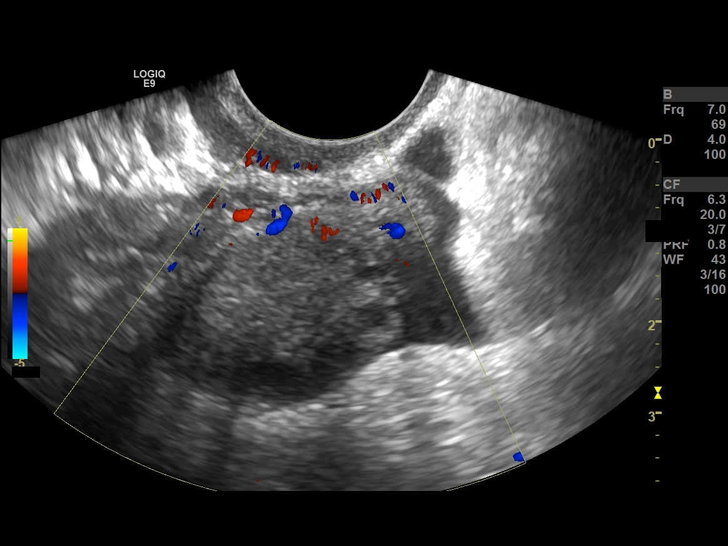
[im 29/37]
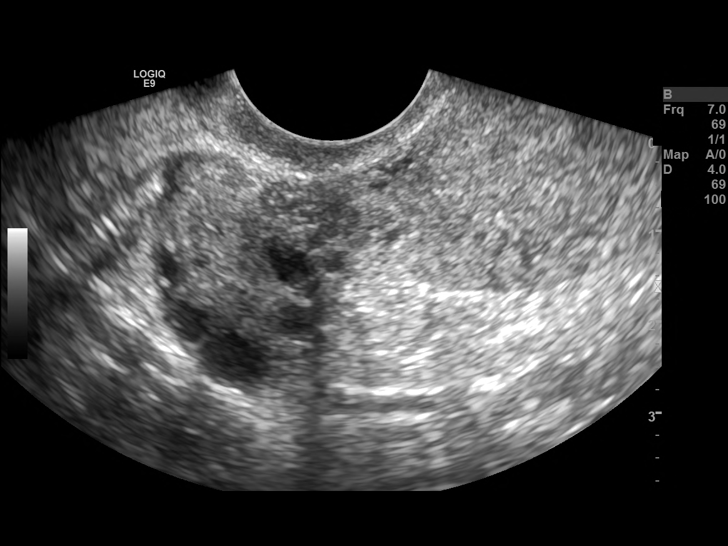
[im 31/37]
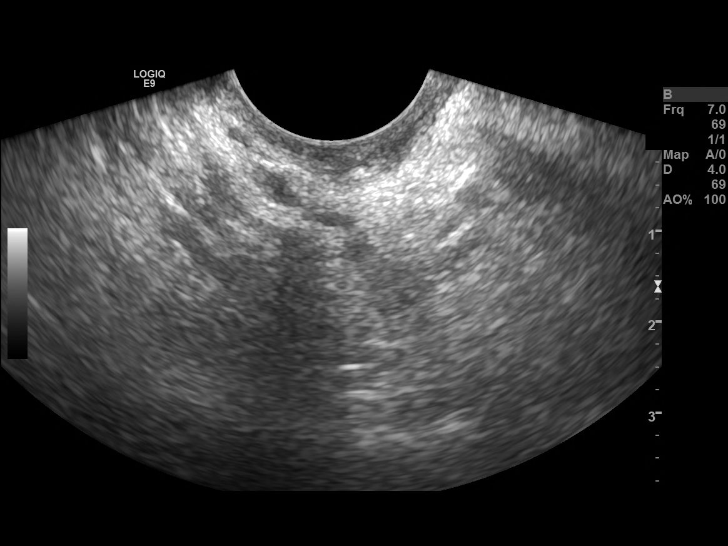
[im 34/37]
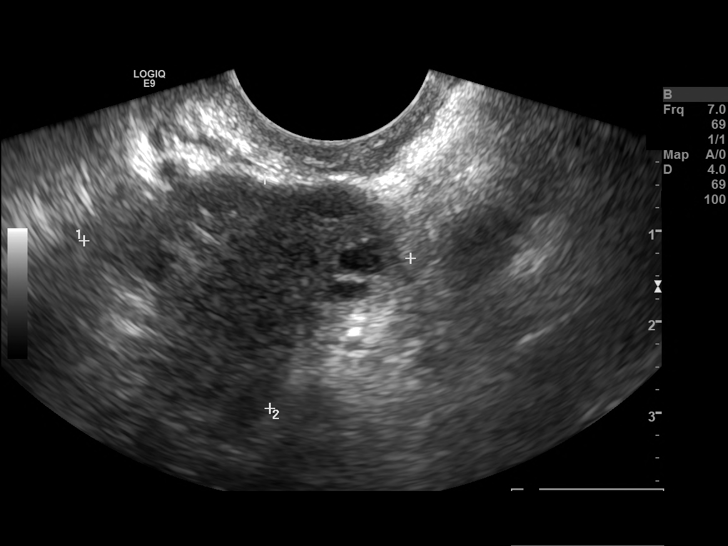
[im 37/37]
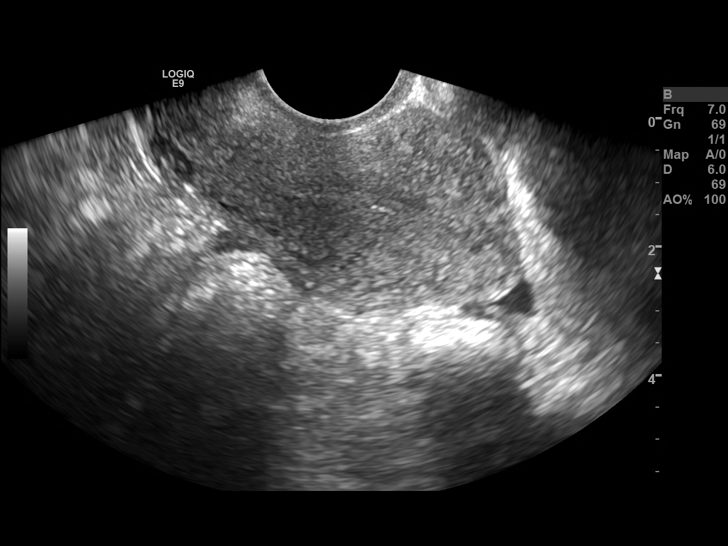

[14 of 28 positions shown; findings below may reference images not displayed]

FINDINGS: The uterus is retroflexed, homogeneous, normal in size and echotexture measuring 67.90 x 32.8 x 46.08 mm. No focal uterine lesion is seen.

The endometrium measures 7.4 mm. 

The right ovary measures 29.0 x 20.7  x 18.2 mm. Right ovary volume is 5.74 ml. 

The left ovary measures 35.9 x 25.2 x 21.4 mm.  Left ovary volume is 10.15 ml.

Doppler arterial and venous color flows are present in both ovaries.

Minimal amount of free fluid was seen in the cul-de-sac. No adnexal mass was seen.
IMPRESSION: Retroflexed uterus normal in size and echotexture with no focal lesion. Minimal amount of free fluid in the cul-de-sac.

## 2021-01-01 IMAGING — CR WRIST LT 3 VWS MIN
2 series · 4 of 4 positions shown · non-contrast
Comparison: None.

Left wrist radiographs, 4 views
INDICATION: Left wrist pain, injury.

[Series 1: pa · 0.17mm/px · 3 of 3 slices shown]
[im 1/3]
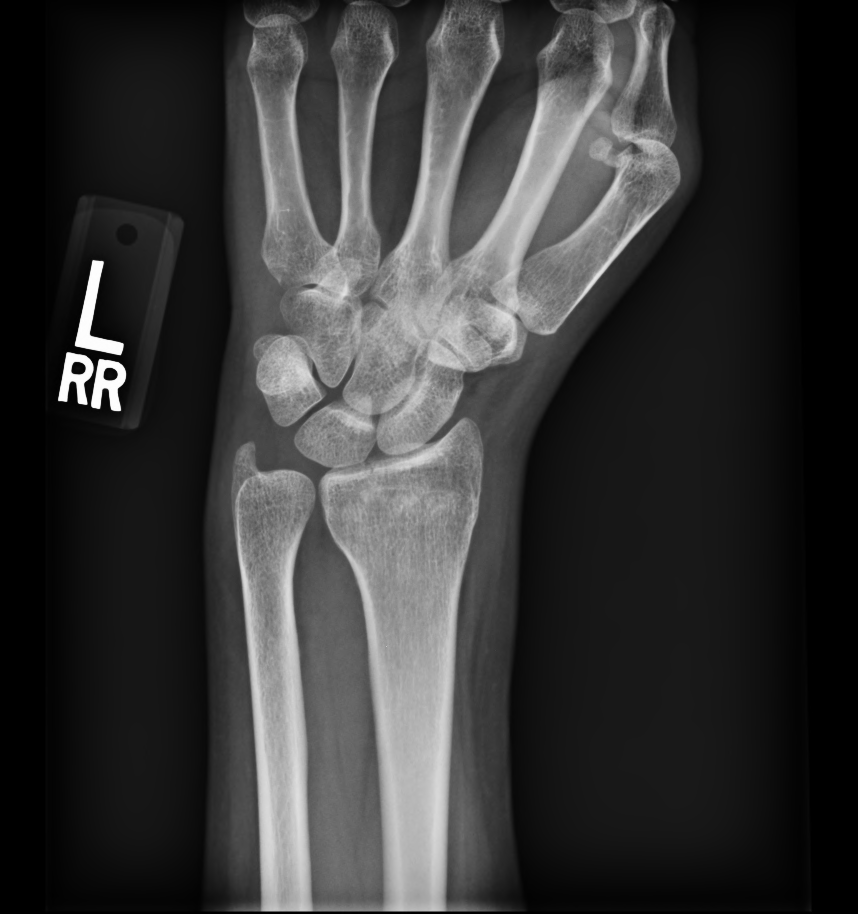
[im 2/3]
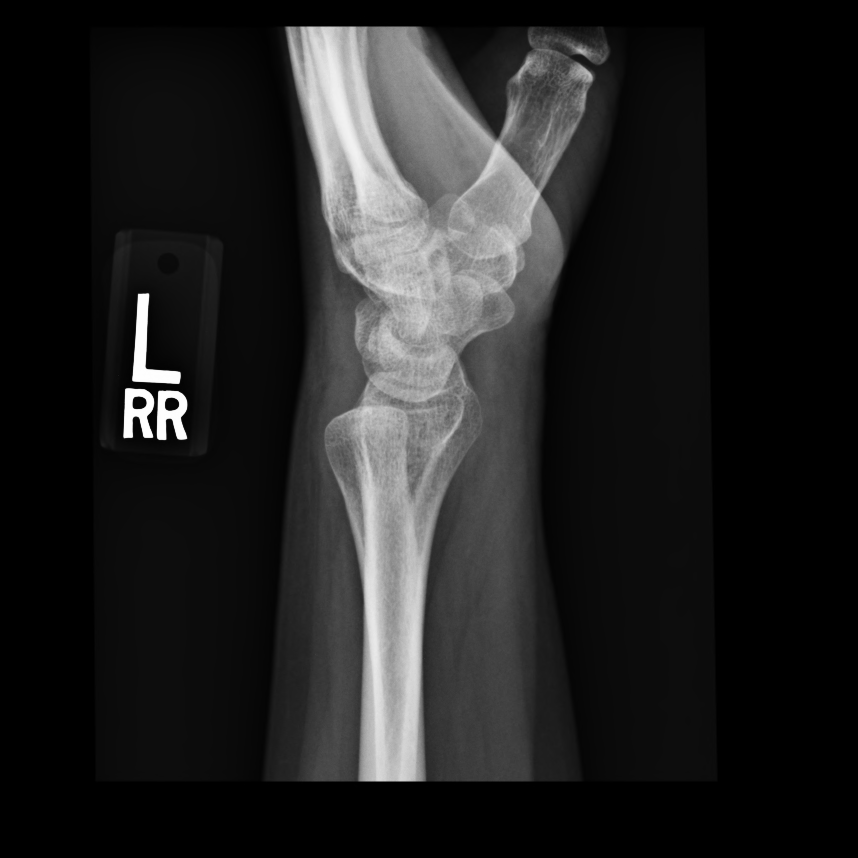
[im 3/3]
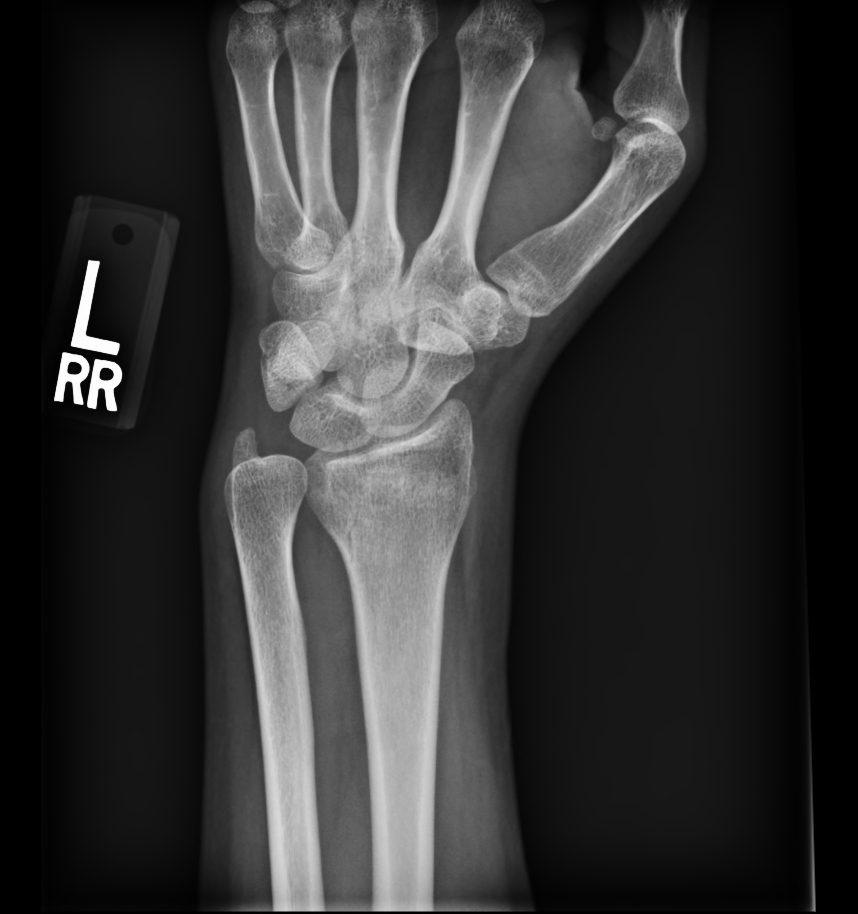

[ulnar flexion]
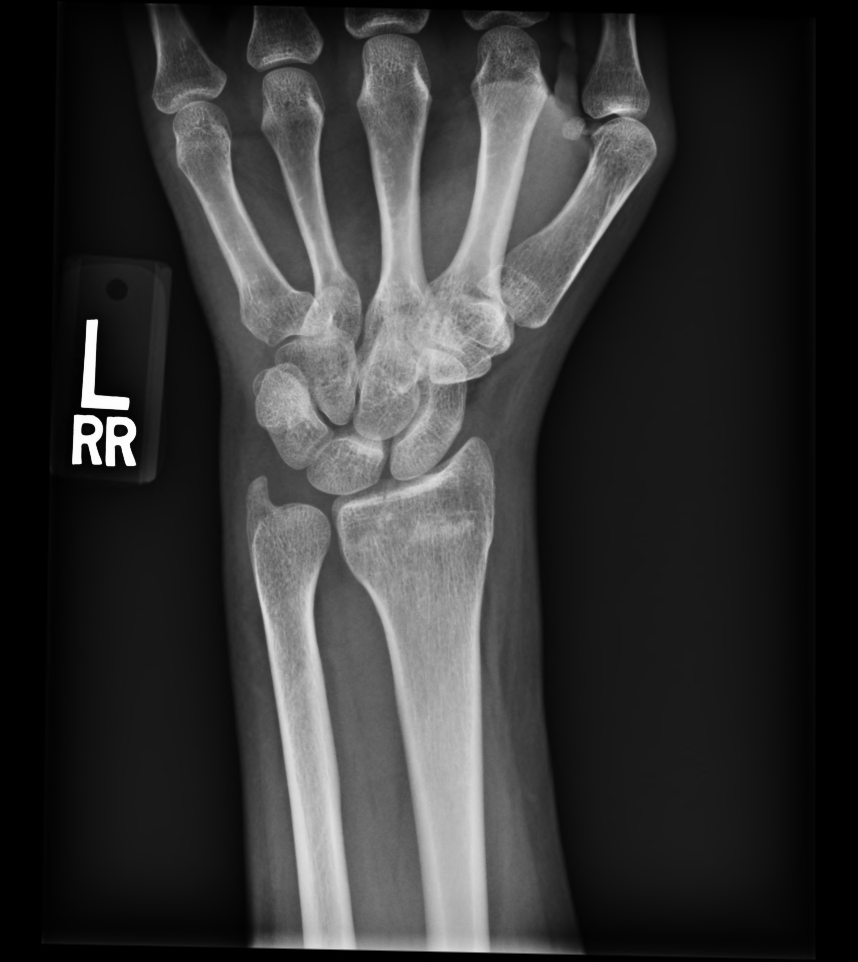

[4 of 4 positions shown; findings below may reference images not displayed]

FINDINGS: Comminuted nondisplaced subacute intra-articular fracture of the distal left radius. Soft tissue swelling about the wrist. Suspect radiocarpal joint effusion. Joint spaces are intact. No erosion.
IMPRESSION: Comminuted nondisplaced subacute intra-articular fracture of the distal left radius.
